# Patient Record
Sex: Female | Born: 1965 | Race: Black or African American | Hispanic: No | Marital: Single | Smoking: Current every day smoker
Health system: Southern US, Community
[De-identification: ages and names within clinical notes are randomized; demographics above are authoritative.]

## PROBLEM LIST (undated history)

## (undated) DIAGNOSIS — I10 Essential (primary) hypertension: Secondary | ICD-10-CM

## (undated) DIAGNOSIS — I471 Supraventricular tachycardia, unspecified: Secondary | ICD-10-CM

## (undated) DIAGNOSIS — J45909 Unspecified asthma, uncomplicated: Secondary | ICD-10-CM

## (undated) HISTORY — DX: Essential (primary) hypertension: I10

## (undated) HISTORY — DX: Supraventricular tachycardia, unspecified: I47.10

## (undated) HISTORY — DX: Unspecified asthma, uncomplicated: J45.909

## (undated) HISTORY — PX: KNEE SURGERY: SHX244

## (undated) HISTORY — DX: Supraventricular tachycardia: I47.1

---

## 2012-12-16 ENCOUNTER — Encounter: Payer: Self-pay | Admitting: Cardiovascular Disease

## 2012-12-16 DIAGNOSIS — R0602 Shortness of breath: Secondary | ICD-10-CM

## 2013-02-25 ENCOUNTER — Encounter: Payer: Self-pay | Admitting: Cardiology

## 2013-03-23 ENCOUNTER — Encounter: Payer: Self-pay | Admitting: Cardiovascular Disease

## 2013-03-28 ENCOUNTER — Encounter: Payer: Medicaid Other | Admitting: Cardiovascular Disease

## 2013-03-28 ENCOUNTER — Encounter: Payer: Self-pay | Admitting: Cardiovascular Disease

## 2013-03-28 ENCOUNTER — Ambulatory Visit (INDEPENDENT_AMBULATORY_CARE_PROVIDER_SITE_OTHER): Payer: Medicaid Other | Admitting: Cardiovascular Disease

## 2013-03-28 VITALS — BP 139/89 | HR 91 | Ht 64.0 in | Wt 180.0 lb

## 2013-03-28 DIAGNOSIS — Z7189 Other specified counseling: Secondary | ICD-10-CM

## 2013-03-28 DIAGNOSIS — I10 Essential (primary) hypertension: Secondary | ICD-10-CM

## 2013-03-28 DIAGNOSIS — R079 Chest pain, unspecified: Secondary | ICD-10-CM | POA: Insufficient documentation

## 2013-03-28 DIAGNOSIS — I471 Supraventricular tachycardia: Secondary | ICD-10-CM

## 2013-03-28 DIAGNOSIS — F17200 Nicotine dependence, unspecified, uncomplicated: Secondary | ICD-10-CM

## 2013-03-28 DIAGNOSIS — Z716 Tobacco abuse counseling: Secondary | ICD-10-CM

## 2013-03-28 MED ORDER — DILTIAZEM HCL 30 MG PO TABS: 30.0000 mg | ORAL_TABLET | ORAL | Status: DC | PRN

## 2013-03-28 NOTE — Patient Instructions (Signed)
   Diltiazem 30mg  as needed for rapid heart rate, no more than 2 in 24 hour time frame - new sent to pharm Continue all other medications.   Establish with primary MD - list provided Your physician has requested that you have a stress echocardiogram. For further information please visit [email or web address] Please follow instruction sheet as given.  - please call office when you are ready to schedule  Follow up in  6-8 weeks

## 2013-03-28 NOTE — Progress Notes (Signed)
Patient ID: ONETA SIGMAN, female   DOB: 1966/04/28, 47 y.o.   MRN: [id number]    CARDIOLOGY CONSULT NOTE  Patient ID: PENNIE VANBLARCOM MRN: [id number] DOB/AGE: 1966/04/25 47 y.o.   Reason for Consultation: PSVT  HPI: Mrs. Vannatter is a 47 year old woman with a PMH significant for PSVT, dating back to 2008. She was hospitalized in 04/2012 with this. At that time, her ECG revealed a short RP tachycardia, which broke with IV adenosine. She had a mildly abnormal troponin at that time (0.24), but a bedside echocardiogram was reportedly normal. She had been tried on metoprolol 25 mg bid in the past, but it caused her to experience fatigue. For this reason, she's been on diltiazem.  She continues to smoke about 0.5 ppd.  She's been treated for acute bronchitis and is finishing a Z-pak and oral prednisone. She had fevers/chills last week. She experiences band-like chest tightness under the breasts and around the back, which occurs at least once a week. She denies associated diaphoresis and very seldom gets lightheaded. She denies syncope. She sometimes experiences associated shortness of breath. The chest pain has been going on for the past year, but more frequent recently.  She had been considering an ablation for SVT last year. She experiences palpitations once a month, but most recently in July.  She is here with her friend, Rosalia Hammers, who delivers furniture for [employer].  SocHx: smokes 0.5 ppd. Moved here from [place] one year ago. FamHx: grandfather had h/o MI's.   Allergies  Allergen Reactions  . Beta Adrenergic Blockers     Makes her tired    Current Outpatient Prescriptions  Medication Sig Dispense Refill  . albuterol (PROVENTIL) 4 MG tablet Take 4 mg by mouth 4 (four) times daily as needed.      . diltiazem (CARDIZEM) 30 MG tablet Take 30 mg by mouth 3 (three) times daily as needed.       No current facility-administered medications for this visit.    Past Medical History   Diagnosis Date  . SVT (supraventricular tachycardia)   . Asthma     questionable  . Hypertension     Past Surgical History  Procedure Laterality Date  . Cesarean section    . Knee surgery      History   Social History  . Marital Status: Single    Spouse Name: N/A    Number of Children: N/A  . Years of Education: N/A   Occupational History  . Not on file.   Social History Main Topics  . Smoking status: Current Every Day Smoker -- 0.12 packs/day for 13 years    Types: Cigarettes    Start date: 06/30/2000  . Smokeless tobacco: Never Used  . Alcohol Use: Yes  . Drug Use: No  . Sexual Activity: Not on file   Other Topics Concern  . Not on file   Social History Narrative  . No narrative on file     Family History  Problem Relation Age of Onset  . Heart attack      grandfather     Prior to Admission medications   Medication Sig Start Date End Date Taking? Authorizing Provider  albuterol (PROVENTIL) 4 MG tablet Take 4 mg by mouth 4 (four) times daily as needed.   Yes Historical Provider, MD  diltiazem (CARDIZEM) 30 MG tablet Take 30 mg by mouth 3 (three) times daily as needed.   Yes Historical Provider, MD     Review of systems  complete and found to be negative unless listed above in HPI   139/89 mmHg, 151/100 91 bpm  Physical exam Height 5\' 4"  (1.626 m), weight 180 lb (81.647 kg). General: NAD Neck: No JVD, no thyromegaly or thyroid nodule.  Lungs: Prolonged expiratory phase with faint wheezes bilaterally with normal respiratory effort. CV: Nondisplaced PMI.  Heart regular S1/S2, no S3/S4, no murmur.  No peripheral edema.  No carotid bruit.  Normal pedal pulses.  Abdomen: Soft, nontender, no hepatosplenomegaly, no distention.  Skin: Intact without lesions or rashes.  Neurologic: Alert and oriented x 3.  Psych: Normal affect. Extremities: No clubbing or cyanosis.  HEENT: Normal.   Labs:   No results found for this basename: WBC, HGB, HCT, MCV, PLT    No results found for this basename: NA, K, CL, CO2, BUN, CREATININE, CALCIUM, LABALBU, PROT, BILITOT, ALKPHOS, ALT, AST, GLUCOSE,  in the last 168 hours No results found for this basename: CKTOTAL, CKMB, CKMBINDEX, TROPONINI    No results found for this basename: CHOL   No results found for this basename: HDL   No results found for this basename: LDLCALC   No results found for this basename: TRIG   No results found for this basename: CHOLHDL   No results found for this basename: LDLDIRECT        Studies: No results found.  ASSESSMENT AND PLAN:  1. PSVT: given that she only experiences once a month, and prefers not to be on long-acting diltiazem, I will prescribe a 90-day supply of 30 mg tablets to be taken prn. I gave her information on a radiofrequency ablation and should she decide on pursuing this, she will inform me.  2. Chest pain: her primary risk factor for CAD is tobacco use. Given that she is still getting over acute bronchitis, I will pursue a stress echocardiogram afterwards, when she is able to give a good effort on a treadmill.  3. HTN: elevated today, but she has also been taking a week of prednisone. I have recommended she obtain a blood pressure monitor. I have asked the patient to check blood pressure readings 4-5 times per week, at different times throughout the day, in order to get a better approximation of mean BP values. These results will be provided to me at the end of that period so that I can determine if antihypertensive medication titration is indicated.  4. Tobacco abuse: I have given her extensive counseling on cessation.  Dispo: I will try to help arrange for her to obtain a PCP.  Signed: Prentice Docker, M.D., F.A.C.C.  03/28/2013, 1:14 PM

## 2013-04-01 ENCOUNTER — Encounter: Payer: Medicaid Other | Admitting: Cardiovascular Disease

## 2013-04-27 ENCOUNTER — Encounter: Payer: Self-pay | Admitting: *Deleted

## 2013-05-09 ENCOUNTER — Ambulatory Visit: Payer: Medicaid Other | Admitting: Cardiovascular Disease

## 2013-05-13 ENCOUNTER — Encounter (HOSPITAL_COMMUNITY): Payer: Self-pay

## 2013-05-13 ENCOUNTER — Ambulatory Visit (HOSPITAL_COMMUNITY)
Admission: RE | Admit: 2013-05-13 | Discharge: 2013-05-13 | Disposition: A | Discharge status: Home / Self Care | Payer: Medicaid Other | Source: Ambulatory Visit | Attending: Cardiovascular Disease | Admitting: Cardiovascular Disease

## 2013-05-13 DIAGNOSIS — I10 Essential (primary) hypertension: Secondary | ICD-10-CM | POA: Insufficient documentation

## 2013-05-13 DIAGNOSIS — R079 Chest pain, unspecified: Secondary | ICD-10-CM | POA: Insufficient documentation

## 2013-05-13 DIAGNOSIS — F17200 Nicotine dependence, unspecified, uncomplicated: Secondary | ICD-10-CM | POA: Insufficient documentation

## 2013-05-13 DIAGNOSIS — I471 Supraventricular tachycardia, unspecified: Secondary | ICD-10-CM | POA: Insufficient documentation

## 2013-05-13 DIAGNOSIS — Z6831 Body mass index (BMI) 31.0-31.9, adult: Secondary | ICD-10-CM | POA: Insufficient documentation

## 2013-05-13 DIAGNOSIS — R072 Precordial pain: Secondary | ICD-10-CM

## 2013-05-13 NOTE — Progress Notes (Addendum)
Stress Lab Nurses Notes - Rhonda Patel  Rhonda Patel 05/13/2013 Reason for doing test: Chest Pain and PSVT Type of test: Stress Echo Nurse performing test: Parke Poisson, RN Nuclear Medicine Tech: Not Applicable Echo Tech: Veda Canning MD performing test: Branch / K.Lawrence NP Family MD : NPCP  Test explained and consent signed: yes IV started: No IV started Symptoms: Fatigue & SOB Treatment/Intervention: None Reason test stopped: reached target HR After recovery IV was: NA Patient to return to [facility]. Med at :NA Patient discharged: Home Patient's Condition upon discharge was: stable Comments: During test BP 196/89 & HR 179.  Recovery BP 136/96 & HR 100.  Symptoms resolved in recovery. [name] T

## 2013-05-13 NOTE — Progress Notes (Signed)
*  PRELIMINARY RESULTS* Echocardiogram Echocardiogram Stress Test has been performed.  Rhonda Patel 05/13/2013, 12:48 PM

## 2013-05-16 ENCOUNTER — Encounter: Payer: Self-pay | Admitting: *Deleted

## 2013-05-30 ENCOUNTER — Encounter: Payer: Self-pay | Admitting: Cardiovascular Disease

## 2013-05-30 ENCOUNTER — Ambulatory Visit (INDEPENDENT_AMBULATORY_CARE_PROVIDER_SITE_OTHER): Payer: Medicaid Other | Admitting: Cardiovascular Disease

## 2013-05-30 VITALS — BP 136/85 | HR 88 | Ht 64.75 in | Wt 178.0 lb

## 2013-05-30 DIAGNOSIS — E876 Hypokalemia: Secondary | ICD-10-CM

## 2013-05-30 DIAGNOSIS — I10 Essential (primary) hypertension: Secondary | ICD-10-CM

## 2013-05-30 DIAGNOSIS — Z7189 Other specified counseling: Secondary | ICD-10-CM

## 2013-05-30 DIAGNOSIS — R079 Chest pain, unspecified: Secondary | ICD-10-CM

## 2013-05-30 DIAGNOSIS — K219 Gastro-esophageal reflux disease without esophagitis: Secondary | ICD-10-CM

## 2013-05-30 DIAGNOSIS — Z716 Tobacco abuse counseling: Secondary | ICD-10-CM

## 2013-05-30 DIAGNOSIS — F17200 Nicotine dependence, unspecified, uncomplicated: Secondary | ICD-10-CM

## 2013-05-30 DIAGNOSIS — I471 Supraventricular tachycardia: Secondary | ICD-10-CM

## 2013-05-30 MED ORDER — OMEPRAZOLE 20 MG PO CPDR: 20.0000 mg | DELAYED_RELEASE_CAPSULE | Freq: Every day | ORAL | Status: DC

## 2013-05-30 MED ORDER — DILTIAZEM HCL 30 MG PO TABS: 30.0000 mg | ORAL_TABLET | Freq: Two times a day (BID) | ORAL | Status: DC

## 2013-05-30 NOTE — Progress Notes (Signed)
Patient ID: Rhonda Patel, female   DOB: 08-20-1965, 47 y.o.   MRN: [id number]      SUBJECTIVE: The patient is here to followup on the results of her cardiovascular testing. She underwent a stress echocardiogram for the evaluation of chest pain, and this test was found to be normal with a Duke treadmill score of 8, giving her a low risk of future cardiovascular events. She had been doing well since her last office visit, until she experienced palpitations on the Monday before Thanksgiving. She said it started suddenly. At her last office visit, I prescribed diltiazem 30 mg to be taken on an as needed basis and she took one tablet. When the palpitations didn't subside she went to [facility]. By the time she was taken back into the room, or palpitations subsided and her heart rate slowed down. She tells me that her potassium level was 3 and she was prescribed potassium supplements, but hasn't started taking this yet. She continues to smoke. She has been having bad heartburn and continues to experience a bandlike chest tightness which goes underneath her breasts. She also has been experiencing dysphagia for solids and not liquids. She says that the tightness is significant enough that she has to remove her bra and then experiences some relief, but the tightness doesn't subside completely. She denies leg swelling. She has noticed herself wheezing on occasion. She had childhood asthma which she outgrew by the age of 29.    Allergies  Allergen Reactions  . Beta Adrenergic Blockers     Makes her tired  . Metoprolol Other (See Comments)    Patient cannot tolerate higher dose of 25 mg daily    Current Outpatient Prescriptions  Medication Sig Dispense Refill  . albuterol (PROVENTIL) 4 MG tablet Take 4 mg by mouth 4 (four) times daily as needed.      . diltiazem (CARDIZEM) 30 MG tablet Take 1 tablet (30 mg total) by mouth as needed. For rapid heart rate.  No more than  2 in 24 hour time frame  90 tablet  3   No current facility-administered medications for this visit.    Past Medical History  Diagnosis Date  . SVT (supraventricular tachycardia)   . Asthma     questionable  . Hypertension     Past Surgical History  Procedure Laterality Date  . Cesarean section    . Knee surgery      History   Social History  . Marital Status: Single    Spouse Name: N/A    Number of Children: N/A  . Years of Education: N/A   Occupational History  . Not on file.   Social History Main Topics  . Smoking status: Current Every Day Smoker -- 0.12 packs/day for 13 years    Types: Cigarettes    Start date: 06/30/2000  . Smokeless tobacco: Never Used  . Alcohol Use: Yes  . Drug Use: No  . Sexual Activity: Not on file   Other Topics Concern  . Not on file   Social History Narrative  . No narrative on file     Filed Vitals:   05/30/13 1416  BP: 136/85  Pulse: 88  Height: 5' 4.75" (1.645 m)  Weight: 178 lb (80.74 kg)    PHYSICAL EXAM General: NAD Neck: No JVD, no thyromegaly or thyroid nodule.  Lungs: Clear to auscultation bilaterally with normal respiratory effort. CV: Nondisplaced PMI.  Heart regular S1/S2, no S3/S4, no murmur.  No  peripheral edema.  No carotid bruit.  Normal pedal pulses.  Abdomen: Soft, nontender, no hepatosplenomegaly, no distention.  Neurologic: Alert and oriented x 3.  Psych: Normal affect. Extremities: No clubbing or cyanosis.   ECG: reviewed and available in electronic records.      ASSESSMENT AND PLAN: 1. PSVT: I will transition diltiazem from a prn basis to it being scheduled 30 mg bid, given her most recent episode, which is her preference. I previously gave her information on a radiofrequency ablation and should she decide on pursuing this, she will inform me.  2. Chest pain: her stress echocardiogram was normal with a Duke treadmill score of 8, portending a low risk for future cardiovascular events. She  appears to have GERD and I did inform her that continued tobacco use will only serve to exacerbate this. I will initiate omeprazole 20 mg daily. There may be some component of asthma as well, given her ongoing tobacco use and audible wheezing. 3. HTN: normotensive today. Will continue to monitor this. 4. GERD: she has symptoms (heartburn, dysphagia for solids) consistent with significant GERD. I will initiate omeprazole 20 mg daily. 5. Hypokalemia: I instructed her to take the K supplementation she was prescribed, given her K of 3 at the ED. I instructed her to consume a diet rich in potassium-rich foods. I will check a BMET before her next visit. 6. Tobacco abuse: I have given her extensive counseling on cessation.   Dispo: f/u in 2 months.    Prentice Docker, M.D., F.A.C.C.

## 2013-05-30 NOTE — Patient Instructions (Signed)
   Increase Cardizem to 30mg  twice a day - new sent to pharm  Begin Omeprazole 20mg  daily - new sent to pharm Continue all other medications.   Lab for BMET - do just prior to next office visit Office will contact with results via phone or letter.   Follow up in  2 months

## 2013-08-08 ENCOUNTER — Ambulatory Visit: Payer: Medicaid Other | Admitting: Cardiovascular Disease

## 2013-09-14 ENCOUNTER — Encounter: Payer: Self-pay | Admitting: Cardiovascular Disease

## 2013-09-14 ENCOUNTER — Ambulatory Visit: Payer: Medicaid Other | Admitting: Cardiovascular Disease

## 2015-04-16 ENCOUNTER — Encounter: Payer: Self-pay | Admitting: Gastroenterology

## 2015-04-19 ENCOUNTER — Emergency Department (HOSPITAL_COMMUNITY): Payer: 59

## 2015-04-19 ENCOUNTER — Emergency Department (HOSPITAL_COMMUNITY)
Admission: EM | Admit: 2015-04-19 | Discharge: 2015-04-19 | Disposition: A | Discharge status: Home / Self Care | Payer: 59 | Attending: Emergency Medicine | Admitting: Emergency Medicine

## 2015-04-19 ENCOUNTER — Encounter (HOSPITAL_COMMUNITY): Payer: Self-pay | Admitting: *Deleted

## 2015-04-19 DIAGNOSIS — R197 Diarrhea, unspecified: Secondary | ICD-10-CM | POA: Insufficient documentation

## 2015-04-19 DIAGNOSIS — R6883 Chills (without fever): Secondary | ICD-10-CM | POA: Diagnosis not present

## 2015-04-19 DIAGNOSIS — Z79899 Other long term (current) drug therapy: Secondary | ICD-10-CM | POA: Diagnosis not present

## 2015-04-19 DIAGNOSIS — I10 Essential (primary) hypertension: Secondary | ICD-10-CM | POA: Diagnosis not present

## 2015-04-19 DIAGNOSIS — R1033 Periumbilical pain: Secondary | ICD-10-CM | POA: Insufficient documentation

## 2015-04-19 DIAGNOSIS — J45909 Unspecified asthma, uncomplicated: Secondary | ICD-10-CM | POA: Diagnosis not present

## 2015-04-19 DIAGNOSIS — R1030 Lower abdominal pain, unspecified: Secondary | ICD-10-CM | POA: Diagnosis present

## 2015-04-19 DIAGNOSIS — R1110 Vomiting, unspecified: Secondary | ICD-10-CM | POA: Insufficient documentation

## 2015-04-19 DIAGNOSIS — R61 Generalized hyperhidrosis: Secondary | ICD-10-CM | POA: Diagnosis not present

## 2015-04-19 DIAGNOSIS — Z720 Tobacco use: Secondary | ICD-10-CM | POA: Insufficient documentation

## 2015-04-19 DIAGNOSIS — R109 Unspecified abdominal pain: Secondary | ICD-10-CM

## 2015-04-19 LAB — URINALYSIS, ROUTINE W REFLEX MICROSCOPIC
BILIRUBIN URINE: NEGATIVE
Glucose, UA: NEGATIVE mg/dL
Leukocytes, UA: NEGATIVE
NITRITE: NEGATIVE
PH: 6 (ref 5.0–8.0)
Protein, ur: NEGATIVE mg/dL
Specific Gravity, Urine: 1.02 (ref 1.005–1.030)
UROBILINOGEN UA: 0.2 mg/dL (ref 0.0–1.0)

## 2015-04-19 LAB — COMPREHENSIVE METABOLIC PANEL
ALT: 35 U/L (ref 14–54)
ANION GAP: 14 (ref 5–15)
AST: 68 U/L — ABNORMAL HIGH (ref 15–41)
Albumin: 4.3 g/dL (ref 3.5–5.0)
Alkaline Phosphatase: 113 U/L (ref 38–126)
BILIRUBIN TOTAL: 1.1 mg/dL (ref 0.3–1.2)
BUN: 11 mg/dL (ref 6–20)
CHLORIDE: 97 mmol/L — AB (ref 101–111)
CO2: 26 mmol/L (ref 22–32)
Calcium: 9.2 mg/dL (ref 8.9–10.3)
Creatinine, Ser: 0.74 mg/dL (ref 0.44–1.00)
Glucose, Bld: 96 mg/dL (ref 65–99)
POTASSIUM: 3.2 mmol/L — AB (ref 3.5–5.1)
Sodium: 137 mmol/L (ref 135–145)
TOTAL PROTEIN: 7.9 g/dL (ref 6.5–8.1)

## 2015-04-19 LAB — CBC WITH DIFFERENTIAL/PLATELET
Basophils Absolute: 0.1 10*3/uL (ref 0.0–0.1)
Basophils Relative: 1 %
Eosinophils Absolute: 0.2 10*3/uL (ref 0.0–0.7)
Eosinophils Relative: 2 %
HEMATOCRIT: 40.5 % (ref 36.0–46.0)
Hemoglobin: 13.9 g/dL (ref 12.0–15.0)
LYMPHS PCT: 28 %
Lymphs Abs: 2.7 10*3/uL (ref 0.7–4.0)
MCH: 30.6 pg (ref 26.0–34.0)
MCHC: 34.3 g/dL (ref 30.0–36.0)
MCV: 89.2 fL (ref 78.0–100.0)
MONO ABS: 0.5 10*3/uL (ref 0.1–1.0)
MONOS PCT: 5 %
NEUTROS ABS: 6.3 10*3/uL (ref 1.7–7.7)
Neutrophils Relative %: 64 %
PLATELETS: 490 10*3/uL — AB (ref 150–400)
RBC: 4.54 MIL/uL (ref 3.87–5.11)
RDW: 14.2 % (ref 11.5–15.5)
WBC: 9.8 10*3/uL (ref 4.0–10.5)

## 2015-04-19 LAB — URINE MICROSCOPIC-ADD ON

## 2015-04-19 LAB — LIPASE, BLOOD: LIPASE: 35 U/L (ref 11–51)

## 2015-04-19 LAB — CBG MONITORING, ED: GLUCOSE-CAPILLARY: 89 mg/dL (ref 65–99)

## 2015-04-19 MED ORDER — IOHEXOL 300 MG/ML  SOLN
100.0000 mL | Freq: Once | INTRAMUSCULAR | Status: AC | PRN
  Administered 2015-04-19: 100 mL via INTRAVENOUS

## 2015-04-19 MED ORDER — MORPHINE SULFATE (PF) 4 MG/ML IV SOLN
4.0000 mg | Freq: Once | INTRAVENOUS | Status: AC
  Administered 2015-04-19: 4 mg via INTRAVENOUS
  Filled 2015-04-19: qty 1

## 2015-04-19 MED ORDER — PROCHLORPERAZINE EDISYLATE 5 MG/ML IJ SOLN
5.0000 mg | Freq: Once | INTRAMUSCULAR | Status: AC
  Administered 2015-04-19: 5 mg via INTRAVENOUS
  Filled 2015-04-19: qty 2

## 2015-04-19 MED ORDER — POTASSIUM CHLORIDE CRYS ER 20 MEQ PO TBCR
40.0000 meq | EXTENDED_RELEASE_TABLET | Freq: Once | ORAL | Status: AC
  Administered 2015-04-19: 40 meq via ORAL
  Filled 2015-04-19: qty 2

## 2015-04-19 NOTE — ED Notes (Signed)
Patient reports ongoing abd pain x 3 months. Has an appt to see GI doctor in November. Report weight loss of approx. 20 pounds in three months. Reports some diarrhea in the beginning and it is progressing to everyday.

## 2015-04-19 NOTE — ED Notes (Signed)
EDPA in to reeval pt

## 2015-04-19 NOTE — Discharge Instructions (Signed)
Your lab test are well within normal limits. Your CT scan shows no acute presurgical situation. Please see the GI specialist as scheduled. Please return to the emergency department if any emergent changes, problems, or concerns. Abdominal Pain, Adult Many things can cause belly (abdominal) pain. Most times, the belly pain is not dangerous. Many cases of belly pain can be watched and treated at home. HOME CARE   Do not take medicines that help you go poop (laxatives) unless told to by your doctor.  Only take medicine as told by your doctor.  Eat or drink as told by your doctor. Your doctor will tell you if you should be on a special diet. GET HELP IF:  You do not know what is causing your belly pain.  You have belly pain while you are sick to your stomach (nauseous) or have runny poop (diarrhea).  You have pain while you pee or poop.  Your belly pain wakes you up at night.  You have belly pain that gets worse or better when you eat.  You have belly pain that gets worse when you eat fatty foods.  You have a fever. GET HELP RIGHT AWAY IF:   The pain does not go away within 2 hours.  You keep throwing up (vomiting).  The pain changes and is only in the right or left part of the belly.  You have bloody or tarry looking poop. MAKE SURE YOU:   Understand these instructions.  Will watch your condition.  Will get help right away if you are not doing well or get worse.   This information is not intended to replace advice given to you by your health care provider. Make sure you discuss any questions you have with your health care provider.   Document Released: 12/03/2007 Document Revised: 07/07/2014 Document Reviewed: 02/23/2013 Elsevier Interactive Patient Education [employer].

## 2015-04-19 NOTE — ED Provider Notes (Signed)
CSN: [id number]     Arrival date & time 04/19/15  0801 History  By signing my name below, I, Rhonda Patel, attest that this documentation has been prepared under the direction and in the presence of  Rhonda Kocher, PA-C. Electronically Signed: Hansel Patel, ED Scribe. 04/19/2015. 8:31 AM.    Chief Complaint  Patient presents with  . Abdominal Pain   The history is provided by the patient. No language interpreter was used.   HPI Comments: Rhonda Patel is a 49 y.o. female who presents to the Emergency Department complaining of moderate, gradually worsening, intermittent, sharp lower abdominal pain and cramping onset 3 months ago and worsened yesterday with associated diarrhea, unintentional weight loss (~20lbs), emesis (reports vomiting food up), chills, diaphoresis. She reports emesis every morning, but none at night or during the day. She notes occasional blood streaking in the emesis but reports that is is mostly bile or food previously consumed. She notes that symptoms are worsened with food consumption. Pt was seen by her PCP 14 days ago, who was concerned for a low grade fever. She was referred to GI and was not able to get in until Dec 8. She notes that she requested a referral for another GI to get an appointment sooner but has not yet heard back for scheduling.  No h/o abdominal surgery aside from c-section. No recent changes in diet. Pt was recently prescribed cardizem and nexium by her PCP. She denies known fevers, bloody stools.    Past Medical History  Diagnosis Date  . SVT (supraventricular tachycardia) ([place])   . Asthma     questionable  . Hypertension    Past Surgical History  Procedure Laterality Date  . Cesarean section    . Knee surgery     Family History  Problem Relation Age of Onset  . Heart attack      grandfather   Social History  Substance Use Topics  . Smoking status: Current Every Day Smoker -- 0.12 packs/day for 13 years    Types: Cigarettes    Start date:  06/30/2000  . Smokeless tobacco: Never Used  . Alcohol Use: Yes   OB History    No data available     Review of Systems  Constitutional: Positive for chills, diaphoresis and unexpected weight change. Negative for fever.  Gastrointestinal: Positive for vomiting, abdominal pain and diarrhea. Negative for blood in stool.  All other systems reviewed and are negative.  Allergies  Beta adrenergic blockers and Metoprolol  Home Medications   Prior to Admission medications   Medication Sig Start Date End Date Taking? Authorizing Provider  albuterol (PROVENTIL) 4 MG tablet Take 4 mg by mouth 4 (four) times daily as needed.    Historical Provider, MD  diltiazem (CARDIZEM) 30 MG tablet Take 1 tablet (30 mg total) by mouth 2 (two) times daily. 05/30/13   Herminio Commons, MD  omeprazole (PRILOSEC) 20 MG capsule Take 1 capsule (20 mg total) by mouth daily. 05/30/13   Herminio Commons, MD   BP 132/94 mmHg  Pulse 90  Temp(Src) 97.9 F (36.6 C) (Oral)  Resp 16  Ht 5\' 4"  (1.626 m)  Wt 161 lb (73.029 kg)  BMI 27.62 kg/m2  SpO2 100% Physical Exam  Constitutional: She is oriented to person, place, and time. She appears well-developed and well-nourished.  HENT:  Head: Normocephalic and atraumatic.  Eyes: Conjunctivae and EOM are normal. Pupils are equal, round, and reactive to light.  Conjunctiva is clear.  Neck: Normal range of motion. Neck supple.  Cardiovascular: Normal rate, regular rhythm and normal heart sounds.   Pulmonary/Chest: Effort normal and breath sounds normal. No respiratory distress. She has no wheezes. She has no rales.  Lungs CTA bilaterally.   Abdominal: Soft. Bowel sounds are normal. She exhibits no distension. There is tenderness.  Moderate periumbilical TTP. Good bowel sounds.   Genitourinary:  Stool negative for occult blood. Chaperone present during exam.  Musculoskeletal: Normal range of motion. She exhibits no edema.  Negative Homan's sign. Cap refill is  less than 2 seconds.   Lymphadenopathy:    She has no cervical adenopathy.  Neurological: She is alert and oriented to person, place, and time.  Skin: Skin is warm and dry.  Good color.   Psychiatric: She has a normal mood and affect. Her behavior is normal.  Nursing note and vitals reviewed.  ED Course  Procedures (including critical care time) DIAGNOSTIC STUDIES: Oxygen Saturation is 100% on RA, normal by my interpretation.    COORDINATION OF CARE: 8:29 AM Discussed treatment plan with pt at bedside and pt agreed to plan. Plan for CBC, Cmet, CT Abd/pelv, Urinalysis.    Labs Review Labs Reviewed - No data to display  Imaging Review No results found. I have personally reviewed and evaluated these images and lab results as part of my medical decision-making.  MDM  Vital signs reviewed. Pain improved with IV pain meds. No vomiting or diarrhea while in ED. UA non-acute. Cbc wnl. CMP reveals low potassium of 3.2. AST elevated at 68 o/w wnl. Lipase wnl, doubt pancreatitis. CT abd/pelvis is negative for acute findings. Exam and lab and CT discussed with patient in terms she understands. Strongly encourage pt to see GI as scheduled. They will return to the ED if any emergent changes or concerns.   Final diagnoses:  None    *I have reviewed nursing notes, vital signs, and all appropriate lab and imaging results for this patient.** **I personally performed the services described in this documentation, which was scribed in my presence. The recorded information has been reviewed and is accurate.Rhonda Kocher, PA-C 04/20/15 2119  Rhonda Fraise, MD 04/20/15 [phone]

## 2015-05-03 ENCOUNTER — Encounter: Payer: Self-pay | Admitting: Gastroenterology

## 2015-05-03 ENCOUNTER — Ambulatory Visit (INDEPENDENT_AMBULATORY_CARE_PROVIDER_SITE_OTHER): Payer: 59 | Admitting: Gastroenterology

## 2015-05-03 VITALS — BP 142/98 | HR 96 | Temp 98.3°F | Ht 64.0 in | Wt 165.0 lb

## 2015-05-03 DIAGNOSIS — K589 Irritable bowel syndrome without diarrhea: Secondary | ICD-10-CM | POA: Insufficient documentation

## 2015-05-03 DIAGNOSIS — R197 Diarrhea, unspecified: Secondary | ICD-10-CM

## 2015-05-03 DIAGNOSIS — K219 Gastro-esophageal reflux disease without esophagitis: Secondary | ICD-10-CM | POA: Insufficient documentation

## 2015-05-03 MED ORDER — ELUXADOLINE 100 MG PO TABS: 1.0000 | ORAL_TABLET | Freq: Two times a day (BID) | ORAL | Status: DC

## 2015-05-03 NOTE — Addendum Note (Signed)
Addended by: Danie Binder on: 05/03/2015 09:31 AM   Modules accepted: Orders

## 2015-05-03 NOTE — Assessment & Plan Note (Signed)
SYMPTOMS FAIRLY WELL CONTROLLED.  AVOID FOOD THAT TRIGGERS REFLUX. SEE INFO BELOW.   Add lactobacillus PROBIOTIC DAILY.  TRY VIBERZI. TAKE ONE TABLET TWICE DAILY. USE IMODIUM 1 OR 2 TIMES A DAY IF NEEDED.  YOU MUST CALL IF YOU GO MORE THAN 4 DAYS WITHOUT A BOWEL MOVEMENT OR YOU DEVELOP INCREASE IN NAUSEA, VOMITING, OR ABDOMINAL PAIN.  CONTINUE NEXIUM. TAKE 30 MINUTES BEFORE YOUR FIRST MEAL DAILY.  PLEASE CALL IN ONE MONTH IF YOUR SYMPTOMS ARE NOT BETTER.   FOLLOW UP IN 6 WEEKS.

## 2015-05-03 NOTE — Progress Notes (Signed)
APPT MADE

## 2015-05-03 NOTE — Progress Notes (Signed)
Subjective:    Patient ID: Rhonda Patel, female    DOB: May 03, 1966, 49 y.o.   MRN: [id number]  VYAS,DHRUV B., MD   HPI WAS HAVING REGULAR BOWEL: 2-3X/DAY. SYMPTOMS: began 3 mos ago, now worse over last 1.5 mos. INCLUDES ABDOMINAL PAIN, RUMBLING, FOLLOWED BY LOOSE STOOL/ EXPLOSIVE WATERY STOOLS(UP TO 5X IN A WORK DAY). NL FORMED STOOL: ONCE OR TWICE A WEEK. MAY BE UP WITH BM AND ABDOMINAL CRAMPS AT NIGHT 1X/WEEK. FEELS PRESSURE UNDER BREASTS. PAIN: SHARP. ACHY-UPPER AND LOWER. BETTER: AFTER BM THEN THE CYCLE STARTS AGAIN AND LASTS UNTIL SHE HAS A BM. COUPLE TIMES ON WAY HOME HAS HAD A IMMEDIATE DRIVE BY. RIDING AROUND WITH TOILET PAPER ON STAND BY. GETS HOT AND COLD. FLARES BEGAN AFTER STRESSFUL DAY AT WORK. HEARTBURN: DRINK WATER AND HAS BURNING AND FEEL LIKE SOMETHING COMING BACK UP. [place] A LOT. CAFFEINE: 1 CUP COFFEE/DAY. SMOKES: < 1 PK/DAY. QUIT BC OR GOODY POWDERS. RARE IBUPROFEN/MOTRIN, OR NAPROXEN/ALEVE: 1-2X/MO. ETOH: 3-4 X/WEEK(VODKA IN MIXED DRINK, NO CHANGE). WEIGHT: ??181 TO 165 LBS. TREATED FOR ANXIETY AND DEPRESSION BUT COULDN'T TAKE MEDS. NO THYROID PROBLEMS. NO ABX, TRAVEL, OR WELL WATER. NO NH OR HOSPITAL VISIT. NO SICK CONTACT. NO SORES IN MOUTH, RASH ON LEGS. OCCASIONAL JOINT PAIN, AND BACK PAIN.  PT DENIES FEVER, CHILLS, HEMATOCHEZIA, nausea, vomiting, melena, CHEST PAIN, SHORTNESS OF BREATH,  CHANGE IN BOWEL IN HABITS, constipation, problems swallowing, OR problems with sedation.   Past Medical History  Diagnosis Date  . SVT (supraventricular tachycardia) ([place])   . Asthma     questionable  . Hypertension    Past Surgical History  Procedure Laterality Date  . Cesarean section    . Knee surgery     Allergies  Allergen Reactions  . Beta Adrenergic Blockers     Makes her tired  . Metoprolol Other (See Comments)    Patient cannot tolerate higher dose of 25 mg daily    Current Outpatient Prescriptions  Medication Sig Dispense Refill  . diltiazem (CARDIZEM)  30 MG tablet Take 1 tablet (30 mg total) by mouth 2 (two) times daily.    Marland Kitchen esomeprazole (NEXIUM) 40 MG capsule Take 40 mg by mouth daily aFTER BREAKFAST    . lisinopril-hydrochlorothiazide (PRINZIDE,ZESTORETIC) 10-12.5 MG tablet Take 1 tablet by mouth daily.    . sucralfate (CARAFATE) 1 G tablet Take 1 g by mouth.    . diphenoxylate-atropine (LOMOTIL) 2.5-0.025 MG tablet Take 1 tablet by mouth 4 (four) times daily as needed for diarrhea or loose stools.     Family History  Problem Relation Age of Onset  . Heart attack      grandfather  . Colon cancer Neg Hx   . Colon polyps Neg Hx   . Celiac disease Neg Hx   . Pancreatic cancer Neg Hx   . Stomach cancer Neg Hx   . Ulcerative colitis Neg Hx   . Crohn's disease Neg Hx     Social History   Social History  . Marital Status: Single    Spouse Name: N/A  . Number of Children: N/A  . Years of Education: N/A   Social History Main Topics  . Smoking status: Current Every Day Smoker -- 0.12 packs/day for 13 years    Types: Cigarettes    Start date: 06/30/2000  . Smokeless tobacco: Never Used  . Alcohol Use: Yes  . Drug Use: No  . Sexual Activity: Not Asked   Other Topics Concern  . None  Social History Narrative   Review of Systems PER HPI OTHERWISE ALL SYSTEMS ARE NEGATIVE.     Objective:   Physical Exam  Constitutional: She is oriented to person, place, and time. She appears well-developed and well-nourished. No distress.  HENT:  Head: Normocephalic and atraumatic.  Mouth/Throat: Oropharynx is clear and moist. No oropharyngeal exudate.  Eyes: Pupils are equal, round, and reactive to light. No scleral icterus.  Neck: Normal range of motion. Neck supple.  Cardiovascular: Normal rate, regular rhythm and normal heart sounds.   Pulmonary/Chest: Effort normal and breath sounds normal. No respiratory distress.  Abdominal: Soft. Bowel sounds are normal. She exhibits no distension. There is no tenderness.  Musculoskeletal: She  exhibits no edema.  Lymphadenopathy:    She has no cervical adenopathy.  Neurological: She is alert and oriented to person, place, and time.  NO FOCAL DEFICITS   Psychiatric:  FLAT AFFECT, SLIGHTLY ANXIOUS MOOD  Vitals reviewed.         Assessment & Plan:

## 2015-05-03 NOTE — Progress Notes (Signed)
cc'ed to pcp °

## 2015-05-03 NOTE — Patient Instructions (Signed)
DRINK WATER TO KEEP YOUR URINE LIGHT YELLOW.  FOLLOW A LOW FAT DIET. MEATS SHOULD BE BAKED, BROILED, OR BOILED. SEE INFO BELOW.  AVOID FOOD THAT TRIGGERS REFLUX. SEE INFO BELOW.   Add lactobacillus PROBIOTIC DAILY.  TRY VIBERZI. TAKE ONE TABLET TWICE DAILY. USE IMODIUM 1 OR 2 TIMES A DAY IF NEEDED.  YOU MUST CALL IF YOU GO MORE THAN 4 DAYS WITHOUT A BOWEL MOVEMENT OR YOU DEVELOP INCREASE IN NAUSEA, VOMITING, OR ABDOMINAL PAIN.  CONTINUE NEXIUM. TAKE 30 MINUTES BEFORE YOUR FIRST MEAL DAILY.  PLEASE CALL IN ONE MONTH IF YOUR SYMPTOMS ARE NOT BETTER.   FOLLOW UP IN 6 WEEKS.    Lifestyle and home remedies TO HELP CONTROL HEARTBURN.  You may eliminate or reduce the frequency of heartburn by making the following lifestyle changes:  . Control your weight. Being overweight is a major risk factor for heartburn and GERD. Excess pounds put pressure on your abdomen, pushing up your stomach and causing acid to back up into your esophagus.   . Eat smaller meals. 4 TO 6 MEALS A DAY. This reduces pressure on the lower esophageal sphincter, helping to prevent the valve from opening and acid from washing back into your esophagus.   Dolphus Jenny your belt. Clothes that fit tightly around your waist put pressure on your abdomen and the lower esophageal sphincter.  .  . Eliminate heartburn triggers. Everyone has specific triggers. Common triggers such as fatty or fried foods, spicy food, tomato sauce, carbonated beverages, alcohol, chocolate, mint, garlic, onion, caffeine and nicotine may make heartburn worse.   Marland Kitchen Avoid stooping or bending. Tying your shoes is OK. Bending over for longer periods to weed your garden isn't, especially soon after eating.   . Don't lie down after a meal. Wait at least three to four hours after eating before going to bed, and don't lie down right after eating.   Marland Kitchen PLACE THE HEAD OF YOUR BED ON 6 INCH BLOCKS.  Alternative medicine . Several home remedies exist for treating  GERD, but they provide only temporary relief. They include drinking baking soda (sodium bicarbonate) added to water or drinking other fluids such as baking soda mixed with cream of tartar and water. . Although these liquids create temporary relief by neutralizing, washing away or buffering acids, eventually they aggravate the situation by adding gas and fluid to your stomach, increasing pressure and causing more acid reflux. Further, adding more sodium to your diet may increase your blood pressure and add stress to your heart, and excessive bicarbonate ingestion can alter the acid-base balance in your body.  Low-Fat Diet BREADS, CEREALS, PASTA, RICE, DRIED PEAS, AND BEANS These products are high in carbohydrates and most are low in fat. Therefore, they can be increased in the diet as substitutes for fatty foods. They too, however, contain calories and should not be eaten in excess. Cereals can be eaten for snacks as well as for breakfast.   FRUITS AND VEGETABLES It is good to eat fruits and vegetables. Besides being sources of fiber, both are rich in vitamins and some minerals. They help you get the daily allowances of these nutrients. Fruits and vegetables can be used for snacks and desserts.  MEATS Limit lean meat, chicken, [place], and fish to no more than 6 ounces per day. Beef, Pork, and Lamb Use lean cuts of beef, pork, and lamb. Lean cuts include:  Extra-lean ground beef.  Arm roast.  Sirloin tip.  Center-cut ham.  Round steak.  Loin  chops.  Rump roast.  Tenderloin.  Trim all fat off the outside of meats before cooking. It is not necessary to severely decrease the intake of red meat, but lean choices should be made. Lean meat is rich in protein and contains a highly absorbable form of iron. Premenopausal women, in particular, should avoid reducing lean red meat because this could increase the risk for low red blood cells (iron-deficiency anemia).  Chicken and [place] These are good  sources of protein. The fat of poultry can be reduced by removing the skin and underlying fat layers before cooking. Chicken and [place] can be substituted for lean red meat in the diet. Poultry should not be fried or covered with high-fat sauces. Fish and Shellfish Fish is a good source of protein. Shellfish contain cholesterol, but they usually are low in saturated fatty acids. The preparation of fish is important. Like chicken and [place], they should not be fried or covered with high-fat sauces. EGGS Egg whites contain no fat or cholesterol. They can be eaten often. Try 1 to 2 egg whites instead of whole eggs in recipes or use egg substitutes that do not contain yolk. MILK AND DAIRY PRODUCTS Use skim or 1% milk instead of 2% or whole milk. Decrease whole milk, natural, and processed cheeses. Use nonfat or low-fat (2%) cottage cheese or low-fat cheeses made from vegetable oils. Choose nonfat or low-fat (1 to 2%) yogurt. Experiment with evaporated skim milk in recipes that call for heavy cream. Substitute low-fat yogurt or low-fat cottage cheese for sour cream in dips and salad dressings. Have at least 2 servings of low-fat dairy products, such as 2 glasses of skim (or 1%) milk each day to help get your daily calcium intake. FATS AND OILS Reduce the total intake of fats, especially saturated fat. Butterfat, lard, and beef fats are high in saturated fat and cholesterol. These should be avoided as much as possible. Vegetable fats do not contain cholesterol, but certain vegetable fats, such as coconut oil, palm oil, and palm kernel oil are very high in saturated fats. These should be limited. These fats are often used in bakery goods, processed foods, popcorn, oils, and nondairy creamers. Vegetable shortenings and some peanut butters contain hydrogenated oils, which are also saturated fats. Read the labels on these foods and check for saturated vegetable oils. Unsaturated vegetable oils and fats do not raise  blood cholesterol. However, they should be limited because they are fats and are high in calories. Total fat should still be limited to 30% of your daily caloric intake. Desirable liquid vegetable oils are corn oil, cottonseed oil, olive oil, canola oil, safflower oil, soybean oil, and sunflower oil. Peanut oil is not as good, but small amounts are acceptable. Buy a heart-healthy tub margarine that has no partially hydrogenated oils in the ingredients. Mayonnaise and salad dressings often are made from unsaturated fats, but they should also be limited because of their high calorie and fat content. Seeds, nuts, peanut butter, olives, and avocados are high in fat, but the fat is mainly the unsaturated type. These foods should be limited mainly to avoid excess calories and fat. OTHER EATING TIPS Snacks  Most sweets should be limited as snacks. They tend to be rich in calories and fats, and their caloric content outweighs their nutritional value. Some good choices in snacks are graham crackers, melba toast, soda crackers, bagels (no egg), English muffins, fruits, and vegetables. These snacks are preferable to snack crackers, [place] fries, TORTILLA CHIPS, and POTATO  chips. Popcorn should be air-popped or cooked in small amounts of liquid vegetable oil. Desserts Eat fruit, low-fat yogurt, and fruit ices instead of pastries, cake, and cookies. Sherbet, angel food cake, gelatin dessert, frozen low-fat yogurt, or other frozen products that do not contain saturated fat (pure fruit juice bars, frozen ice pops) are also acceptable.  COOKING METHODS Choose those methods that use little or no fat. They include: Poaching.  Braising.  Steaming.  Grilling.  Baking.  Stir-frying.  Broiling.  Microwaving.  Foods can be cooked in a nonstick pan without added fat, or use a nonfat cooking spray in regular cookware. Limit fried foods and avoid frying in saturated fat. Add moisture to lean meats by using water, broth,  cooking wines, and other nonfat or low-fat sauces along with the cooking methods mentioned above. Soups and stews should be chilled after cooking. The fat that forms on top after a few hours in the refrigerator should be skimmed off. When preparing meals, avoid using excess salt. Salt can contribute to raising blood pressure in some people.  EATING AWAY FROM HOME Order entres, potatoes, and vegetables without sauces or butter. When meat exceeds the size of a deck of cards (3 to 4 ounces), the rest can be taken home for another meal. Choose vegetable or fruit salads and ask for low-calorie salad dressings to be served on the side. Use dressings sparingly. Limit high-fat toppings, such as bacon, crumbled eggs, cheese, sunflower seeds, and olives. Ask for heart-healthy tub margarine instead of butter.

## 2015-05-03 NOTE — Assessment & Plan Note (Signed)
DIFFERENTIAL DIAGNOSIS INCLUDES: IBS-D, MICROSCOPIC COLITIS. LESS LIKELY THYROID DISTURBANCE, GIARDIASIS, C DIFF COLITIS, OR IBD.  AVOID FOOD THAT TRIGGERS REFLUX. SEE INFO BELOW.   Add lactobacillus PROBIOTIC DAILY.  TRY VIBERZI. TAKE ONE TABLET TWICE DAILY. USE IMODIUM 1 OR 2 TIMES A DAY IF NEEDED.  YOU MUST CALL IF YOU GO MORE THAN 4 DAYS WITHOUT A BOWEL MOVEMENT OR YOU DEVELOP INCREASE IN NAUSEA, VOMITING, OR ABDOMINAL PAIN.  CONTINUE NEXIUM. TAKE 30 MINUTES BEFORE YOUR FIRST MEAL DAILY.  PLEASE CALL IN ONE MONTH IF YOUR SYMPTOMS ARE NOT BETTER.   FOLLOW UP IN 6 WEEKS.

## 2015-05-09 ENCOUNTER — Telehealth: Payer: Self-pay

## 2015-05-09 NOTE — Telephone Encounter (Signed)
Pt left VM that she was not able to get her Viberzi at the pharmacy. I called and LMOM for her to have pharmacy send over the info for PA if that is what is needed.

## 2015-05-10 NOTE — Telephone Encounter (Signed)
Pt is aware that [facility] received the PA and is working on it.

## 2015-06-14 ENCOUNTER — Ambulatory Visit (INDEPENDENT_AMBULATORY_CARE_PROVIDER_SITE_OTHER): Payer: 59 | Admitting: Gastroenterology

## 2015-06-14 ENCOUNTER — Encounter: Payer: Self-pay | Admitting: Gastroenterology

## 2015-06-14 VITALS — BP 185/95 | HR 86 | Temp 97.6°F | Ht 64.0 in | Wt 169.0 lb

## 2015-06-14 DIAGNOSIS — K589 Irritable bowel syndrome without diarrhea: Secondary | ICD-10-CM | POA: Diagnosis not present

## 2015-06-14 NOTE — Progress Notes (Signed)
ON RECALL  °

## 2015-06-14 NOTE — Patient Instructions (Addendum)
Try VIBERZI 75 MG DAILY. PLEASE CALL IF YOU WOULD LIKE YOUR PRESCRIPTION CHANGED.  DRINK WATER TO KEEP YOUR URINE LIGHT YELLOW.  FOLLOW A HIGH FIBER DIET. AVOID ITEMS THAT CAUSE BLOATING & GAS.   CONTINUE ACTIVIA AS LOG AS IT DOES NOT CAUSE DIARRHEA, BLOATING AND GAS  TAKE A PROBIOTIC DAILY FOR 3 MOS ([facility] BRAND, [facility],OR  ALIGN).   FOLLOW UP IN 4 MOS.

## 2015-06-14 NOTE — Assessment & Plan Note (Signed)
DIARRHEA PREDOMINANT. CLINICALLY IMPROVED. SYMPTOMS NOT IDEALLY CONTROLLED.  REDUCE DOSE OF VIBERZI. Try VIBERZI 75 MG ONCE OR TWICE DAILY. PLEASE CALL IF YOU WOULD LIKE YOUR PRESCRIPTION CHANGED. DRINK WATER TO KEEP  URINE LIGHT YELLOW. FOLLOW A HIGH FIBER DIET. AVOID ITEMS THAT CAUSE BLOATING & GAS.  CONTINUE ACTIVIA AS LOG AS IT DOES NOT CAUSE DIARRHEA, BLOATING AND GAS TAKE A PROBIOTIC DAILY FOR 3 MOS ([facility] BRAND, [facility],OR  ALIGN).  FOLLOW UP IN 4 MOS.

## 2015-06-14 NOTE — Progress Notes (Signed)
CC'ED TO PCP 

## 2015-06-14 NOTE — Progress Notes (Signed)
   Subjective:    Patient ID: Rhonda Patel, female    DOB: 04-Dec-1965, 49 y.o.   MRN: [id number] Rhonda Patel., MD  HPI SEE NOV 2016. DIDN;T START PROBIOTIC. TAKING VIBERZI ONCE A DAY. CAN MAKE HER FEEL SLEEPY OR DIZZY. TAKES AFTER SHE EATS SOMETHING. RARE DIARRHEA WHEN SHE MISSES A DOSE BUT NOT AS EXPLOSIVE. EATS YOGURT FOR BREAKFAST(ACTIVIA OR OATMEAL). BMs: THIS WEEK #3 1-2X/DAY.  ABD PAIN IS BETTER. HAD IT LAST WEEK ONE DAY AND THAT ONLY HAPPENED ONCE. IF TAKES VIBERZI ONCE A DAY BECAUSE IT MAKES HER FEEL FUNNY. PT DENIES FEVER, CHILLS, HEMATOCHEZIA, nausea, vomiting, melena, CHEST PAIN, SHORTNESS OF BREATH,  CHANGE IN BOWEL IN HABITS, constipation, problems swallowing, OR heartburn or indigestion.  Past Medical History  Diagnosis Date  . SVT (supraventricular tachycardia) ([place])   . Asthma     questionable  . Hypertension     Past Surgical History  Procedure Laterality Date  . Cesarean section    . Knee surgery      right    Allergies  Allergen Reactions  . Beta Adrenergic Blockers     Makes her tired  . Metoprolol Other (See Comments)    Patient cannot tolerate higher dose of 25 mg daily   Current Outpatient Prescriptions  Medication Sig Dispense Refill  . diltiazem (CARDIZEM) 30 MG tablet Take 1 tablet (30 mg total) by mouth 2 (two) times daily.    . diphenoxylate-atropine (LOMOTIL) 2.5-0.025 MG tablet Take 1 tablet by mouth 4 (four) times daily as needed for diarrhea or loose stools. MISSED VIBERZI ~1 WK AGO   . Eluxadoline (VIBERZI) 100 MG TABS Take 1 tablet by mouth 2 (two) times daily.    Marland Kitchen esomeprazole (NEXIUM) 40 MG capsule Take 40 mg by mouth daily at 12 noon.    Marland Kitchen lisinopril-hydrochlorothiazide (PRINZIDE,ZESTORETIC) 10-12.5 MG tablet Take 1 tablet by mouth daily.    . sucralfate (CARAFATE) 1 G tablet Take 1 g by mouth.     Review of Systems PER HPI OTHERWISE ALL SYSTEMS ARE NEGATIVE.    Objective:   Physical Exam  Constitutional: She is oriented to person,  place, and time. She appears well-developed and well-nourished. No distress.  HENT:  Head: Normocephalic and atraumatic.  Mouth/Throat: Oropharynx is clear and moist. No oropharyngeal exudate.  Eyes: Pupils are equal, round, and reactive to light. No scleral icterus.  Neck: Normal range of motion. Neck supple.  Cardiovascular: Normal rate, regular rhythm and normal heart sounds.   Pulmonary/Chest: Effort normal and breath sounds normal. No respiratory distress.  Abdominal: Soft. Bowel sounds are normal. She exhibits no distension. There is tenderness. There is no rebound and no guarding.  MILD TTP x4  Musculoskeletal: She exhibits no edema.  Lymphadenopathy:    She has no cervical adenopathy.  Neurological: She is alert and oriented to person, place, and time.  NO FOCAL DEFICITS  Psychiatric: She has a normal mood and affect.  Vitals reviewed.     Assessment & Plan:

## 2015-07-24 ENCOUNTER — Encounter: Payer: Self-pay | Admitting: Gastroenterology

## 2015-07-24 ENCOUNTER — Ambulatory Visit (INDEPENDENT_AMBULATORY_CARE_PROVIDER_SITE_OTHER): Payer: 59 | Admitting: Gastroenterology

## 2015-07-24 VITALS — BP 192/106 | HR 80 | Temp 97.8°F | Ht 64.0 in | Wt 160.4 lb

## 2015-07-24 DIAGNOSIS — K589 Irritable bowel syndrome without diarrhea: Secondary | ICD-10-CM

## 2015-07-24 LAB — CBC
HEMATOCRIT: 40.9 % (ref 36.0–46.0)
Hemoglobin: 13.6 g/dL (ref 12.0–15.0)
MCH: 30.1 pg (ref 26.0–34.0)
MCHC: 33.3 g/dL (ref 30.0–36.0)
MCV: 90.5 fL (ref 78.0–100.0)
MPV: 9.4 fL (ref 8.6–12.4)
Platelets: 471 10*3/uL — ABNORMAL HIGH (ref 150–400)
RBC: 4.52 MIL/uL (ref 3.87–5.11)
RDW: 14.3 % (ref 11.5–15.5)
WBC: 9.7 10*3/uL (ref 4.0–10.5)

## 2015-07-24 LAB — COMPLETE METABOLIC PANEL WITH GFR
ALT: 24 U/L (ref 6–29)
AST: 46 U/L — ABNORMAL HIGH (ref 10–35)
Albumin: 4.2 g/dL (ref 3.6–5.1)
Alkaline Phosphatase: 111 U/L (ref 33–115)
BILIRUBIN TOTAL: 1 mg/dL (ref 0.2–1.2)
BUN: 8 mg/dL (ref 7–25)
CALCIUM: 9.1 mg/dL (ref 8.6–10.2)
CHLORIDE: 98 mmol/L (ref 98–110)
CO2: 25 mmol/L (ref 20–31)
CREATININE: 0.69 mg/dL (ref 0.50–1.10)
GFR, Est African American: >89 mL/min (ref >=60)
GFR, Est Non African American: >89 mL/min (ref >=60)
Glucose, Bld: 95 mg/dL (ref 65–99)
Potassium: 3.6 mmol/L (ref 3.5–5.3)
Sodium: 134 mmol/L — ABNORMAL LOW (ref 135–146)
TOTAL PROTEIN: 7 g/dL (ref 6.1–8.1)

## 2015-07-24 LAB — TSH: TSH: 0.922 u[IU]/mL (ref 0.350–4.500)

## 2015-07-24 MED ORDER — ELUXADOLINE 75 MG PO TABS: 1.0000 | ORAL_TABLET | Freq: Two times a day (BID) | ORAL | Status: DC

## 2015-07-24 NOTE — Patient Instructions (Signed)
Please complete blood work and stool studies. We will call with the results.  Start taking a probiotic daily such as Align, Restora, Digestive Advantage, [facility], [facility] brand  I have printed a Viberzi prescription and given a copay card to take to your pharmacy for one month free.   After review of everything, we will set you up for a colonoscopy.   I have provided a letter so you may be out an additional day and return to work on Thursday.

## 2015-07-24 NOTE — Progress Notes (Signed)
Referring Provider: Glenda Chroman., MD Primary Care Physician:  Glenda Chroman., MD  Primary GI: Dr. Oneida Alar   Chief Complaint  Patient presents with  . Irritable Bowel Syndrome    HPI:   Rhonda Patel is a 50 y.o. female presenting today with a history of likely IBS-D. Diarrhea started in May 2016, intermittently. Has worsened.   Over past weekend, had breakfast, then had a "flare" of IBS that lasted all weekend. Flare described as abdominal cramping and going to the bathroom constantly. No rectal bleeding. She feels almost like if she doesn't eat, it will stop, but it didn't. Still was having symptoms on Monday. Second day out of work. Finally ate some kale greens last night. Had a few pieces of potato. Small amount of pork steak. Ate enough to coat her stomach. States symptoms have improved slightly since the weekend. States she can go days without eating. Has been off and on with Viberzi if she hasn't eaten anything. Has had poor oral intake for over a year now. Unsure if it is stress related or not. Thinks she may be distracted by other things and not eat. No abdominal pain with eating. Feels like if she won't eat, she won't be running to the bathroom. Managing reflux symptoms with Nexium. Viberzi 100 mg made her feel sleepy. She has been just using the samples here and there.   Baseline for patient will be 2  dozen loose stools a day. Drinks a vodka and cranberry about 3-4 times week.   No sick contacts. No recent antibiotic exposure. [place]. Didn't pay attention to if she had fever or chills. Doesn't think she did. No N/V. No prior colonoscopy. Feels more anxious than normal. Some depression. Beginning and end of year is difficult at work, very busy.   Past Medical History  Diagnosis Date  . SVT (supraventricular tachycardia) ([place])   . Asthma   . Hypertension     Past Surgical History  Procedure Laterality Date  . Cesarean section    . Knee surgery      right     Current Outpatient Prescriptions  Medication Sig Dispense Refill  . diltiazem (CARDIZEM) 30 MG tablet Take 1 tablet (30 mg total) by mouth 2 (two) times daily. 60 tablet 6  . esomeprazole (NEXIUM) 40 MG capsule Take 40 mg by mouth daily at 12 noon.    Marland Kitchen lisinopril-hydrochlorothiazide (PRINZIDE,ZESTORETIC) 10-12.5 MG tablet Take 1 tablet by mouth daily.    . Eluxadoline (VIBERZI) 75 MG TABS Take 1 tablet by mouth 2 (two) times daily with a meal. 60 tablet 3   No current facility-administered medications for this visit.    Allergies as of 07/24/2015 - Review Complete 06/14/2015  Allergen Reaction Noted  . Beta adrenergic blockers  02/25/2013  . Metoprolol Other (See Comments) 05/30/2013    Family History  Problem Relation Age of Onset  . Heart attack      grandfather  . Colon cancer Neg Hx   . Colon polyps Neg Hx   . Celiac disease Neg Hx   . Pancreatic cancer Neg Hx   . Stomach cancer Neg Hx   . Ulcerative colitis Neg Hx   . Crohn's disease Neg Hx     Social History   Social History  . Marital Status: Single    Spouse Name: N/A  . Number of Children: N/A  . Years of Education: N/A   Occupational History  . [employer]  Social History Main Topics  . Smoking status: Current Every Day Smoker -- 0.12 packs/day for 13 years    Types: Cigarettes    Start date: 06/30/2000  . Smokeless tobacco: Never Used  . Alcohol Use: 0.0 oz/week    0 Standard drinks or equivalent per week     Comment: Drinks at least 3-4 times a week, vodka and cranberry, normally after work   . Drug Use: No  . Sexual Activity: Not on file   Other Topics Concern  . Not on file   Social History Narrative    Review of Systems: Gen: see HPI  CV: Denies chest pain, palpitations, syncope, peripheral edema, and claudication. Resp: mild wheezing lately  GI: see HPI  Derm: Denies rash, itching, dry skin Psych: see HPI  Heme: Denies bruising, bleeding, and enlarged lymph nodes.  Physical  Exam: BP 192/106 mmHg  Pulse 80  Temp(Src) 97.8 F (36.6 C) (Oral)  Ht 5\' 4"  (1.626 m)  Wt 160 lb 6.4 oz (72.757 kg)  BMI 27.52 kg/m2 General:   Alert and oriented. No distress noted. Pleasant and cooperative.  Head:  Normocephalic and atraumatic. Eyes:  Conjuctiva clear without scleral icterus. Mouth:  Oral mucosa pink and moist. Good dentition. No lesions. Heart:  S1, S2 present without murmurs, rubs, or gallops. Regular rate and rhythm. Abdomen:  +BS, soft, non-tender and non-distended. No rebound or guarding. No HSM or masses noted. Msk:  Symmetrical without gross deformities. Normal posture. Extremities:  Without edema. Neurologic:  Alert and  oriented x4;  grossly normal neurologically. Psych:  Alert and cooperative. Normal mood and affect.

## 2015-07-25 ENCOUNTER — Other Ambulatory Visit: Payer: Self-pay | Admitting: Gastroenterology

## 2015-07-25 LAB — IGA: IgA: 216 mg/dL (ref 69–380)

## 2015-07-26 LAB — CLOSTRIDIUM DIFFICILE BY PCR: Toxigenic C. Difficile by PCR: NOT DETECTED

## 2015-07-26 LAB — TISSUE TRANSGLUTAMINASE, IGA: Tissue Transglutaminase Ab, IgA: 1 U/mL (ref <4)

## 2015-07-29 LAB — STOOL CULTURE

## 2015-07-30 LAB — GIARDIA ANTIGEN

## 2015-07-31 NOTE — Assessment & Plan Note (Signed)
50 year old female with likely IBS-D, presenting with acute on chronic diarrhea. No prior colonoscopy. Has done well on lower dose Viberzi at 75 mg BID, but she is not taking currently. Due to worsening of symptoms, check stool studies, labs, and then resume Viberzi. Probiotic recommended daily. Will ultimately need colonoscopy with Dr. Oneida Alar in the near future for routine screening purposes. Further recommendations to follow.

## 2015-08-01 NOTE — Progress Notes (Signed)
CC'D TO PCP °

## 2015-08-02 NOTE — Progress Notes (Signed)
Quick Note:  Cdiff PCR negative. TSH normal, celiac serologies negative, AST mildly elevated at 46 likely secondary to ETOH use, no anemia but platelets elevated at 471. She may resume Viberzi. We need to set her up for a screening colonoscopy with Dr. Oneida Alar with Phenergan 25 mg IV on call. ______

## 2015-08-03 NOTE — Progress Notes (Signed)
Quick Note:  Pt returned call and was informed and Ok to schedule the colonoscopy. ______

## 2015-08-03 NOTE — Progress Notes (Signed)
Quick Note:  LMOM to call. ______ 

## 2015-08-06 ENCOUNTER — Other Ambulatory Visit: Payer: Self-pay

## 2015-08-06 DIAGNOSIS — Z1211 Encounter for screening for malignant neoplasm of colon: Secondary | ICD-10-CM

## 2015-08-06 MED ORDER — PEG 3350-KCL-NA BICARB-NACL 420 G PO SOLR: 4000.0000 mL | Freq: Once | ORAL | Status: DC

## 2015-08-16 ENCOUNTER — Telehealth: Payer: Self-pay

## 2015-08-16 NOTE — Telephone Encounter (Signed)
Pt left a Vm stating that she will possible be on her menses on 08/20/2015 when she is scheduled for the colonoscopy. She wanted to know if she needed to reschedule. I called and told her that is fine, and it will not interfere with the procedure at all. She is fine with that.

## 2015-08-16 NOTE — Telephone Encounter (Signed)
REVIEWED-NO ADDITIONAL RECOMMENDATIONS. 

## 2015-08-20 ENCOUNTER — Ambulatory Visit (HOSPITAL_COMMUNITY)
Admission: RE | Admit: 2015-08-20 | Discharge: 2015-08-20 | Disposition: A | Discharge status: Home / Self Care | Payer: 59 | Source: Ambulatory Visit | Attending: Gastroenterology | Admitting: Gastroenterology

## 2015-08-20 ENCOUNTER — Encounter (HOSPITAL_COMMUNITY): Payer: Self-pay | Admitting: *Deleted

## 2015-08-20 ENCOUNTER — Encounter (HOSPITAL_COMMUNITY)
Admission: RE | Disposition: A | Discharge status: Home / Self Care | Payer: Self-pay | Source: Ambulatory Visit | Attending: Gastroenterology

## 2015-08-20 DIAGNOSIS — K5730 Diverticulosis of large intestine without perforation or abscess without bleeding: Secondary | ICD-10-CM | POA: Diagnosis not present

## 2015-08-20 DIAGNOSIS — I471 Supraventricular tachycardia: Secondary | ICD-10-CM | POA: Insufficient documentation

## 2015-08-20 DIAGNOSIS — K648 Other hemorrhoids: Secondary | ICD-10-CM | POA: Insufficient documentation

## 2015-08-20 DIAGNOSIS — Z1211 Encounter for screening for malignant neoplasm of colon: Secondary | ICD-10-CM

## 2015-08-20 DIAGNOSIS — R197 Diarrhea, unspecified: Secondary | ICD-10-CM

## 2015-08-20 DIAGNOSIS — J45909 Unspecified asthma, uncomplicated: Secondary | ICD-10-CM | POA: Insufficient documentation

## 2015-08-20 DIAGNOSIS — K621 Rectal polyp: Secondary | ICD-10-CM | POA: Diagnosis not present

## 2015-08-20 DIAGNOSIS — Z79899 Other long term (current) drug therapy: Secondary | ICD-10-CM | POA: Insufficient documentation

## 2015-08-20 DIAGNOSIS — K635 Polyp of colon: Secondary | ICD-10-CM | POA: Diagnosis not present

## 2015-08-20 DIAGNOSIS — D123 Benign neoplasm of transverse colon: Secondary | ICD-10-CM | POA: Diagnosis not present

## 2015-08-20 DIAGNOSIS — I10 Essential (primary) hypertension: Secondary | ICD-10-CM | POA: Insufficient documentation

## 2015-08-20 DIAGNOSIS — F17210 Nicotine dependence, cigarettes, uncomplicated: Secondary | ICD-10-CM | POA: Diagnosis not present

## 2015-08-20 HISTORY — PX: COLONOSCOPY: SHX5424

## 2015-08-20 SURGERY — COLONOSCOPY
Anesthesia: Moderate Sedation

## 2015-08-20 MED ORDER — PROMETHAZINE HCL 25 MG/ML IJ SOLN
INTRAMUSCULAR | Status: AC
  Filled 2015-08-20: qty 1

## 2015-08-20 MED ORDER — MIDAZOLAM HCL 5 MG/5ML IJ SOLN
INTRAMUSCULAR | Status: DC | PRN
  Administered 2015-08-20 (×2): 2 mg via INTRAVENOUS
  Administered 2015-08-20: 1 mg via INTRAVENOUS

## 2015-08-20 MED ORDER — PROMETHAZINE HCL 25 MG/ML IJ SOLN
25.0000 mg | Freq: Once | INTRAMUSCULAR | Status: AC
  Administered 2015-08-20: 25 mg via INTRAVENOUS

## 2015-08-20 MED ORDER — MIDAZOLAM HCL 5 MG/5ML IJ SOLN
INTRAMUSCULAR | Status: AC
  Filled 2015-08-20: qty 10

## 2015-08-20 MED ORDER — SODIUM CHLORIDE 0.9% FLUSH
INTRAVENOUS | Status: AC
  Filled 2015-08-20: qty 10

## 2015-08-20 MED ORDER — MEPERIDINE HCL 100 MG/ML IJ SOLN
INTRAMUSCULAR | Status: AC
  Filled 2015-08-20: qty 2

## 2015-08-20 MED ORDER — MEPERIDINE HCL 100 MG/ML IJ SOLN
INTRAMUSCULAR | Status: DC | PRN
  Administered 2015-08-20: 50 mg via INTRAVENOUS
  Administered 2015-08-20: 25 mg via INTRAVENOUS

## 2015-08-20 MED ORDER — SODIUM CHLORIDE 0.9 % IV SOLN
INTRAVENOUS | Status: DC
Start: 2015-08-20 — End: 2015-08-20
  Administered 2015-08-20: 10:00:00 via INTRAVENOUS

## 2015-08-20 NOTE — Progress Notes (Signed)
REVIEWED-NO ADDITIONAL RECOMMENDATIONS. 

## 2015-08-20 NOTE — H&P (Addendum)
Primary Care Physician:  Glenda Chroman., MD Primary Gastroenterologist:  Dr. Oneida Alar  Pre-Procedure History & Physical: HPI:  Rhonda Patel is a 50 y.o. female here for DIARRHEA.  Past Medical History  Diagnosis Date  . SVT (supraventricular tachycardia) ([place])   . Asthma   . Hypertension     Past Surgical History  Procedure Laterality Date  . Cesarean section    . Knee surgery      right    Prior to Admission medications   Medication Sig Start Date End Date Taking? Authorizing Provider  clonazePAM (KLONOPIN) 0.5 MG tablet Take 0.5 mg by mouth 2 (two) times daily as needed. anxiety 07/25/15  Yes Historical Provider, MD  diltiazem (CARDIZEM) 30 MG tablet Take 1 tablet (30 mg total) by mouth 2 (two) times daily. 05/30/13  Yes Herminio Commons, MD  Eluxadoline (VIBERZI) 75 MG TABS Take 1 tablet by mouth 2 (two) times daily with a meal. 07/24/15  Yes Orvil Feil, NP  esomeprazole (NEXIUM) 40 MG capsule Take 40 mg by mouth daily.    Yes Historical Provider, MD  ibuprofen (ADVIL,MOTRIN) 800 MG tablet Take 800 mg by mouth every 8 (eight) hours as needed. pain 06/06/15  Yes Historical Provider, MD  lisinopril-hydrochlorothiazide (PRINZIDE,ZESTORETIC) 20-12.5 MG tablet Take 1 tablet by mouth daily.   Yes Historical Provider, MD  polyethylene glycol-electrolytes (NULYTELY/GOLYTELY) 420 g solution Take 4,000 mLs by mouth once. 08/06/15  Yes Orvil Feil, NP    Allergies as of 08/06/2015 - Review Complete 07/31/2015  Allergen Reaction Noted  . Beta adrenergic blockers  02/25/2013  . Metoprolol Other (See Comments) 05/30/2013    Family History  Problem Relation Age of Onset  . Heart attack      grandfather  . Colon cancer Neg Hx   . Colon polyps Neg Hx   . Celiac disease Neg Hx   . Pancreatic cancer Neg Hx   . Stomach cancer Neg Hx   . Ulcerative colitis Neg Hx   . Crohn's disease Neg Hx     Social History   Social History  . Marital Status: Single    Spouse Name: N/A  . Number  of Children: N/A  . Years of Education: N/A   Occupational History  . [employer]    Social History Main Topics  . Smoking status: Current Every Day Smoker -- 0.12 packs/day for 13 years    Types: Cigarettes    Start date: 06/30/2000  . Smokeless tobacco: Never Used  . Alcohol Use: 0.0 oz/week    0 Standard drinks or equivalent per week     Comment: Drinks at least 3-4 times a week, vodka and cranberry, normally after work   . Drug Use: No  . Sexual Activity: Not on file   Other Topics Concern  . Not on file   Social History Narrative    Review of Systems: See HPI, otherwise negative ROS   Physical Exam: BP 121/73 mmHg  Pulse 83  Temp(Src) 98.2 F (36.8 C) (Oral)  Resp 18  Ht 5' 4.75" (1.645 m)  Wt 163 lb (73.936 kg)  BMI 27.32 kg/m2  SpO2 98%  LMP 08/14/2015 (Exact Date) General:   Alert,  pleasant and cooperative in NAD Head:  Normocephalic and atraumatic. Neck:  Supple; Lungs:  Clear throughout to auscultation.    Heart:  Regular rate and rhythm. Abdomen:  Soft, nontender and nondistended. Normal bowel sounds, without guarding, and without rebound.   Neurologic:  Alert and  oriented x4;  grossly normal neurologically.  Impression/Plan:    DIARRHEA  Plan:  1. ILEOTCS WITH BIOPSY TODAY

## 2015-08-20 NOTE — Discharge Instructions (Signed)
You had 2 polyps removed. You have small internal hemorrhoids and diverticulosis IN YOUR RIGHT COLON.  I BIOPSIED YOUR COLON.   FOLLOW A HIGH FIBER DIET. AVOID ITEMS THAT CAUSE BLOATING. SEE INFO BELOW.  YOUR BIOPSY RESULTS WILL BE AVAILABLE IN MY CHART FEB 23 AND MY OFFICE WILL CONTACT YOU IN 10-14 DAYS WITH YOUR RESULTS.   Next colonoscopy in 5-10 years.   Colonoscopy Care After Read the instructions outlined below and refer to this sheet in the next week. These discharge instructions provide you with general information on caring for yourself after you leave the hospital. While your treatment has been planned according to the most current medical practices available, unavoidable complications occasionally occur. If you have any problems or questions after discharge, call DR. FIELDS, [phone].  ACTIVITY  You may resume your regular activity, but move at a slower pace for the next 24 hours.   Take frequent rest periods for the next 24 hours.   Walking will help get rid of the air and reduce the bloated feeling in your belly (abdomen).   No driving for 24 hours (because of the medicine (anesthesia) used during the test).   You may shower.   Do not sign any important legal documents or operate any machinery for 24 hours (because of the anesthesia used during the test).    NUTRITION  Drink plenty of fluids.   You may resume your normal diet as instructed by your doctor.   Begin with a light meal and progress to your normal diet. Heavy or fried foods are harder to digest and may make you feel sick to your stomach (nauseated).   Avoid alcoholic beverages for 24 hours or as instructed.    MEDICATIONS  You may resume your normal medications.   WHAT YOU CAN EXPECT TODAY  Some feelings of bloating in the abdomen.   Passage of more gas than usual.   Spotting of blood in your stool or on the toilet paper  .  IF YOU HAD POLYPS REMOVED DURING THE COLONOSCOPY:  Eat a  soft diet IF YOU HAVE NAUSEA, BLOATING, ABDOMINAL PAIN, OR VOMITING.    FINDING OUT THE RESULTS OF YOUR TEST Not all test results are available during your visit. DR. Oneida Alar WILL CALL YOU WITHIN 14 DAYS OF YOUR PROCEDUE WITH YOUR RESULTS. Do not assume everything is normal if you have not heard from DR. FIELDS, CALL HER OFFICE AT [phone].  SEEK IMMEDIATE MEDICAL ATTENTION AND CALL THE OFFICE: [phone] IF:  You have more than a spotting of blood in your stool.   Your belly is swollen (abdominal distention).   You are nauseated or vomiting.   You have a temperature over 101F.   You have abdominal pain or discomfort that is severe or gets worse throughout the day.  Polyps, Colon  A polyp is extra tissue that grows inside your body. Colon polyps grow in the large intestine. The large intestine, also called the colon, is part of your digestive system. It is a long, hollow tube at the end of your digestive tract where your body makes and stores stool. Most polyps are not dangerous. They are benign. This means they are not cancerous. But over time, some types of polyps can turn into cancer. Polyps that are smaller than a pea are usually not harmful. But larger polyps could someday become or may already be cancerous. To be safe, doctors remove all polyps and test them.   PREVENTION There is not one sure  way to prevent polyps. You might be able to lower your risk of getting them if you:  Eat more fruits and vegetables and less fatty food.   Do not smoke.   Avoid alcohol.   Exercise every day.   Lose weight if you are overweight.   Eating more calcium and folate can also lower your risk of getting polyps. Some foods that are rich in calcium are milk, cheese, and broccoli. Some foods that are rich in folate are chickpeas, kidney beans, and spinach.   High-Fiber Diet A high-fiber diet changes your normal diet to include more whole grains, legumes, fruits, and vegetables. Changes in  the diet involve replacing refined carbohydrates with unrefined foods. The calorie level of the diet is essentially unchanged. The Dietary Reference Intake (recommended amount) for adult males is 38 grams per day. For adult females, it is 25 grams per day. Pregnant and lactating women should consume 28 grams of fiber per day. Fiber is the intact part of a plant that is not broken down during digestion. Functional fiber is fiber that has been isolated from the plant to provide a beneficial effect in the body. PURPOSE  Increase stool bulk.   Ease and regulate bowel movements.   Lower cholesterol.  REDUCE RISK OF COLON CANCER  INDICATIONS THAT YOU NEED MORE FIBER  Constipation and hemorrhoids.   Uncomplicated diverticulosis (intestine condition) and irritable bowel syndrome.   Weight management.   As a protective measure against hardening of the arteries (atherosclerosis), diabetes, and cancer.   GUIDELINES FOR INCREASING FIBER IN THE DIET  Start adding fiber to the diet slowly. A gradual increase of about 5 more grams (2 slices of whole-wheat bread, 2 servings of most fruits or vegetables, or 1 bowl of high-fiber cereal) per day is best. Too rapid an increase in fiber may result in constipation, flatulence, and bloating.   Drink enough water and fluids to keep your urine clear or pale yellow. Water, juice, or caffeine-free drinks are recommended. Not drinking enough fluid may cause constipation.   Eat a variety of high-fiber foods rather than one type of fiber.   Try to increase your intake of fiber through using high-fiber foods rather than fiber pills or supplements that contain small amounts of fiber.   The goal is to change the types of food eaten. Do not supplement your present diet with high-fiber foods, but replace foods in your present diet.   INCLUDE A VARIETY OF FIBER SOURCES  Replace refined and processed grains with whole grains, canned fruits with fresh fruits, and  incorporate other fiber sources. White rice, white breads, and most bakery goods contain little or no fiber.   Brown whole-grain rice, buckwheat oats, and many fruits and vegetables are all good sources of fiber. These include: broccoli, Brussels sprouts, cabbage, cauliflower, beets, sweet potatoes, white potatoes (skin on), carrots, tomatoes, eggplant, squash, berries, fresh fruits, and dried fruits.   Cereals appear to be the richest source of fiber. Cereal fiber is found in whole grains and bran. Bran is the fiber-rich outer coat of cereal grain, which is largely removed in refining. In whole-grain cereals, the bran remains. In breakfast cereals, the largest amount of fiber is found in those with "bran" in their names. The fiber content is sometimes indicated on the label.   You may need to include additional fruits and vegetables each day.   In baking, for 1 cup white flour, you may use the following substitutions:   1 cup whole-wheat  flour minus 2 tablespoons.   1/2 cup white flour plus 1/2 cup whole-wheat flour.   Diverticulosis Diverticulosis is a common condition that develops when small pouches (diverticula) form in the wall of the colon. The risk of diverticulosis increases with age. It happens more often in people who eat a low-fiber diet. Most individuals with diverticulosis have no symptoms. Those individuals with symptoms usually experience belly (abdominal) pain, constipation, or loose stools (diarrhea).  HOME CARE INSTRUCTIONS  Increase the amount of fiber in your diet as directed by your caregiver or dietician. This may reduce symptoms of diverticulosis.   Drink at least 6 to 8 glasses of water each day to prevent constipation.   Try not to strain when you have a bowel movement.   Avoiding nuts and seeds to prevent complications is NOT NECESSARY.   FOODS HAVING HIGH FIBER CONTENT INCLUDE:  Fruits. Apple, peach, pear, tangerine, raisins, prunes.   Vegetables. Brussels  sprouts, asparagus, broccoli, cabbage, carrot, cauliflower, romaine lettuce, spinach, summer squash, tomato, winter squash, zucchini.   Starchy Vegetables. Baked beans, kidney beans, lima beans, split peas, lentils, potatoes (with skin).   Grains. Whole wheat bread, brown rice, bran flake cereal, plain oatmeal, white rice, shredded wheat, bran muffins.   SEEK IMMEDIATE MEDICAL CARE IF:  You develop increasing pain or severe bloating.   You have an oral temperature above 101F.   You develop vomiting or bowel movements that are bloody or black.   Hemorrhoids Hemorrhoids are dilated (enlarged) veins around the rectum. Sometimes clots will form in the veins. This makes them swollen and painful. These are called thrombosed hemorrhoids. Causes of hemorrhoids include:  Constipation.   Straining to have a bowel movement.   HEAVY LIFTING  HOME CARE INSTRUCTIONS  Eat a well balanced diet and drink 6 to 8 glasses of water every day to avoid constipation. You may also use a bulk laxative.   Avoid straining to have bowel movements.   Keep anal area dry and clean.   Do not use a donut shaped pillow or sit on the toilet for long periods. This increases blood pooling and pain.   Move your bowels when your body has the urge; this will require less straining and will decrease pain and pressure.    PATIENT INSTRUCTIONS POST-ANESTHESIA  IMMEDIATELY FOLLOWING SURGERY:  Do not drive or operate machinery for the first twenty four hours after surgery.  Do not make any important decisions for twenty four hours after surgery or while taking narcotic pain medications or sedatives.  If you develop intractable nausea and vomiting or a severe headache please notify your doctor immediately.  FOLLOW-UP:  Please make an appointment with your surgeon as instructed. You do not need to follow up with anesthesia unless specifically instructed to do so.  WOUND CARE INSTRUCTIONS (if applicable):  Keep a dry  clean dressing on the anesthesia/puncture wound site if there is drainage.  Once the wound has quit draining you may leave it open to air.  Generally you should leave the bandage intact for twenty four hours unless there is drainage.  If the epidural site drains for more than 36-48 hours please call the anesthesia department.  QUESTIONS?:  Please feel free to call your physician or the hospital operator if you have any questions, and they will be happy to assist you.

## 2015-08-20 NOTE — Op Note (Addendum)
[facility] [address] [place], [address]   COLONOSCOPY PROCEDURE REPORT  PATIENT: Rhonda Patel, Rhonda Patel  MR#: [id number] BIRTHDATE: 01/16/1966 , 4  yrs. old GENDER: female ENDOSCOPIST: Danie Binder, MD REFERRED [id number] Woody Seller, M.D. PROCEDURE DATE:  September 03, 2015 PROCEDURE:   Colonoscopy with cold biopsy polypectomy and Colonoscopy with snare polypectomy INDICATIONS:chronic diarrhea and unexplained diarrhea. MEDICATIONS: Promethazine (Phenergan) 25 mg IV, Demerol 75 mg IV, and Versed 5 mg IV  MD started sedation: 1058. Procedure complete: 1132  DESCRIPTION OF PROCEDURE:    Physical exam was performed.  Informed consent was obtained from the patient after explaining the benefits, risks, and alternatives to procedure.  The patient was connected to monitor and placed in left lateral position. Continuous oxygen was provided by nasal cannula and IV medicine administered through an indwelling cannula.  After administration of sedation and rectal exam, the patients rectum was intubated and the [id number]  colonoscope was advanced under direct visualization to the cecum.  The scope was removed slowly by carefully examining the color, texture, anatomy, and integrity mucosa on the way out.  The patient was recovered in endoscopy and discharged home in satisfactory condition. Estimated blood loss is zero unless otherwise noted in this procedure report.    COLON FINDINGS: A pedunculated polyp measuring 6 mm in size was found in the transverse colon.  A polypectomy was performed using snare cautery.  , A sessile polyp ranging from 2 to 76mm in size was found in the rectum.  A polypectomy was performed with cold forceps.  , There was mild diverticulosis noted in the ascending colon and at the cecum.  , and Moderate sized internal hemorrhoids were found.  PREP QUALITY: excellent.  CECAL W/D TIME: 12       minutes COMPLICATIONS: None  ENDOSCOPIC IMPRESSION: 1.    THREE COLON POLYPS REMOVED 2.   Mild diverticulosis was noted in the ascending colon and at the cecum 3.   Moderate sized internal hemorrhoids  RECOMMENDATIONS: FOLLOW A HIGH FIBER DIET. AWAIT BIOPSY RESULTS. Next colonoscopy in 5-10 years.    _______________________________ Lorrin MaisDanie Binder, MD 09/03/15 8:15 PM Revised: 2015/09/03 8:15 PM   CPT CODES: ICD CODES:  The ICD and CPT codes recommended by this software are interpretations from the data that the clinical staff has captured with the software.  The verification of the translation of this report to the ICD and CPT codes and modifiers is the sole responsibility of the health care institution and practicing physician where this report was generated.  [place]. will not be held responsible for the validity of the ICD and CPT codes included on this report.  AMA assumes no liability for data contained or not contained herein. C[employer] is a Designer, television/film set of the [employer].

## 2015-08-23 ENCOUNTER — Encounter (HOSPITAL_COMMUNITY): Payer: Self-pay | Admitting: Gastroenterology

## 2015-08-28 ENCOUNTER — Telehealth: Payer: Self-pay | Admitting: Gastroenterology

## 2015-08-28 NOTE — Telephone Encounter (Signed)
Please call pt. She had ONE HYPERPLASTIC AND ONE INFLAMED POLYP removed.  FOLLOW A HIGH FIBER DIET. AVOID ITEMS THAT CAUSE BLOATING.  Next colonoscopy in 10 years.

## 2015-08-28 NOTE — Telephone Encounter (Signed)
ON RECALL  °

## 2015-08-29 NOTE — Telephone Encounter (Signed)
LMOM to call.

## 2015-08-30 NOTE — Telephone Encounter (Signed)
Letter mailed to call.  

## 2015-09-03 NOTE — Telephone Encounter (Signed)
Pt is aware of results. 

## 2015-09-19 ENCOUNTER — Encounter: Payer: Self-pay | Admitting: Gastroenterology

## 2015-12-20 ENCOUNTER — Other Ambulatory Visit: Payer: Self-pay

## 2015-12-20 MED ORDER — ELUXADOLINE 75 MG PO TABS: 1.0000 | ORAL_TABLET | Freq: Two times a day (BID) | ORAL | Status: DC

## 2016-02-07 ENCOUNTER — Other Ambulatory Visit: Payer: Self-pay

## 2016-02-07 MED ORDER — ELUXADOLINE 75 MG PO TABS: 75.0000 mg | ORAL_TABLET | Freq: Two times a day (BID) | ORAL | 2 refills | Status: DC

## 2016-02-07 NOTE — Telephone Encounter (Signed)
Verified no past surgical history of cholecystectomy (gallbladder remains in situ)

## 2016-09-07 ENCOUNTER — Emergency Department (HOSPITAL_COMMUNITY)
Admission: EM | Admit: 2016-09-07 | Discharge: 2016-09-07 | Disposition: A | Discharge status: Home / Self Care | Payer: Worker's Compensation | Attending: Emergency Medicine | Admitting: Emergency Medicine

## 2016-09-07 ENCOUNTER — Encounter (HOSPITAL_COMMUNITY): Payer: Self-pay | Admitting: Emergency Medicine

## 2016-09-07 ENCOUNTER — Emergency Department (HOSPITAL_COMMUNITY): Payer: Worker's Compensation

## 2016-09-07 DIAGNOSIS — J45909 Unspecified asthma, uncomplicated: Secondary | ICD-10-CM | POA: Diagnosis not present

## 2016-09-07 DIAGNOSIS — I10 Essential (primary) hypertension: Secondary | ICD-10-CM | POA: Diagnosis not present

## 2016-09-07 DIAGNOSIS — Y9389 Activity, other specified: Secondary | ICD-10-CM | POA: Diagnosis not present

## 2016-09-07 DIAGNOSIS — Z79899 Other long term (current) drug therapy: Secondary | ICD-10-CM | POA: Diagnosis not present

## 2016-09-07 DIAGNOSIS — S0990XA Unspecified injury of head, initial encounter: Secondary | ICD-10-CM | POA: Diagnosis present

## 2016-09-07 DIAGNOSIS — F17210 Nicotine dependence, cigarettes, uncomplicated: Secondary | ICD-10-CM | POA: Diagnosis not present

## 2016-09-07 DIAGNOSIS — Y999 Unspecified external cause status: Secondary | ICD-10-CM | POA: Insufficient documentation

## 2016-09-07 DIAGNOSIS — Y929 Unspecified place or not applicable: Secondary | ICD-10-CM | POA: Diagnosis not present

## 2016-09-07 DIAGNOSIS — S0003XA Contusion of scalp, initial encounter: Secondary | ICD-10-CM | POA: Insufficient documentation

## 2016-09-07 DIAGNOSIS — W208XXA Other cause of strike by thrown, projected or falling object, initial encounter: Secondary | ICD-10-CM | POA: Insufficient documentation

## 2016-09-07 MED ORDER — NAPROXEN 500 MG PO TABS: 500.0000 mg | ORAL_TABLET | Freq: Two times a day (BID) | ORAL | 0 refills | Status: DC

## 2016-09-07 NOTE — Discharge Instructions (Signed)
Rest, follow-up with your primary doctor for recheck.  Return to ER for any worsening symtpoms

## 2016-09-07 NOTE — ED Triage Notes (Addendum)
Pt c/o head pain after some plates were dropped on her head last night while working. Pt c/o nausea, lightheadedness and irritability. Denies loc.

## 2016-09-07 NOTE — ED Provider Notes (Signed)
[facility] DEPT Provider Note   CSN: [id number] Arrival date & time: 09/07/16  1444  By signing my name below, I, Collene Leyden, attest that this documentation has been prepared under the direction and in the presence of Genelle Economou PA-C.  Electronically Signed: Collene Leyden, Scribe. 09/07/16. 5:16 PM.  History   Chief Complaint Chief Complaint  Patient presents with  . Head Injury   HPI Comments: Rhonda Patel is a 51 y.o. female who presents to the Emergency Department complaining of a gradual-onset headache that began last night. Patient states a few wire baskets fell on her head at 8:30 PM on the evening prior to arrival. This is a work related injury.  Patient reports associated nausea, agitation, excessive tearing of her eyes,headache, and light-headedness. Patient describes the pain as shooting with a severity of 8/10. Patient reports taking ibuprofen with no relief in pain. Patient denies any loss of consciousness, back or neck pain, vomiting, dizziness, or visual changes  The history is provided by the patient. No language interpreter was used.    Past Medical History:  Diagnosis Date  . Asthma   . Hypertension   . SVT (supraventricular tachycardia) [facility])     Patient Active Problem List   Diagnosis Date Noted  . Encounter for screening colonoscopy   . IBS (irritable bowel syndrome) 05/03/2015  . GERD (gastroesophageal reflux disease) 05/03/2015  . PSVT (paroxysmal supraventricular tachycardia) ([place]) 03/28/2013  . Chest pain 03/28/2013    Past Surgical History:  Procedure Laterality Date  . CESAREAN SECTION    . COLONOSCOPY N/A 08/20/2015   Procedure: COLONOSCOPY;  Surgeon: Danie Binder, MD;  Location[facility];  Service: Endoscopy;  Laterality: N/A;  1045  . KNEE SURGERY     right    OB History    No data available       Home Medications    Prior to Admission medications   Medication Sig Start Date End Date Taking? Authorizing Provider    clonazePAM (KLONOPIN) 0.5 MG tablet Take 0.5 mg by mouth 2 (two) times daily as needed. anxiety 07/25/15   Historical Provider, MD  diltiazem (CARDIZEM) 30 MG tablet Take 1 tablet (30 mg total) by mouth 2 (two) times daily. 05/30/13   Herminio Commons, MD  Eluxadoline (VIBERZI) 75 MG TABS Take 75 mg by mouth 2 (two) times daily. 02/07/16   Carlis Stable, NP  esomeprazole (NEXIUM) 40 MG capsule Take 40 mg by mouth daily.     Historical Provider, MD  ibuprofen (ADVIL,MOTRIN) 800 MG tablet Take 800 mg by mouth every 8 (eight) hours as needed. pain 06/06/15   Historical Provider, MD  lisinopril-hydrochlorothiazide (PRINZIDE,ZESTORETIC) 20-12.5 MG tablet Take 1 tablet by mouth daily.    Historical Provider, MD  polyethylene glycol-electrolytes (NULYTELY/GOLYTELY) 420 g solution Take 4,000 mLs by mouth once. 08/06/15   Annitta Needs, NP    Family History Family History  Problem Relation Age of Onset  . Heart attack      grandfather  . Colon cancer Neg Hx   . Colon polyps Neg Hx   . Celiac disease Neg Hx   . Pancreatic cancer Neg Hx   . Stomach cancer Neg Hx   . Ulcerative colitis Neg Hx   . Crohn's disease Neg Hx     Social History Social History  Substance Use Topics  . Smoking status: Current Every Day Smoker    Packs/day: 0.12    Years: 13.00    Types: Cigarettes  Start date: 06/30/2000  . Smokeless tobacco: Never Used  . Alcohol use 0.0 oz/week     Comment: Drinks at least 3-4 times a week, vodka and cranberry, normally after work      Allergies   Beta adrenergic blockers and Metoprolol   Review of Systems Review of Systems  Constitutional: Positive for fatigue. Negative for appetite change.  Eyes: Negative for pain and visual disturbance.  Respiratory: Negative for shortness of breath.   Cardiovascular: Negative for chest pain.  Gastrointestinal: Positive for nausea. Negative for vomiting.  Musculoskeletal: Negative for back pain and neck pain.  Skin: Negative for wound.   Neurological: Positive for light-headedness and headaches. Negative for dizziness, syncope, facial asymmetry and speech difficulty.  Psychiatric/Behavioral: Positive for agitation. Negative for confusion.     Physical Exam Updated Vital Signs BP 163/91 (BP Location: Right Arm)   Pulse 78   Temp 97.8 F (36.6 C)   Resp 18   Ht 5' 4.75" (1.645 m)   Wt 150 lb (68 kg)   LMP 08/24/2016   SpO2 100%   BMI 25.15 kg/m   Physical Exam  Constitutional: She is oriented to person, place, and time. She appears well-developed.  HENT:  Head: Normocephalic and atraumatic.  Mouth/Throat: Oropharynx is clear and moist.  4 cm frontal scalp hematoma.   Eyes: Conjunctivae and EOM are normal. Pupils are equal, round, and reactive to light.  Neck: Normal range of motion. Neck supple.  Cardiovascular: Normal rate, regular rhythm and intact distal pulses.   Pulmonary/Chest: Effort normal and breath sounds normal.  Musculoskeletal: Normal range of motion.  Neurological: She is alert and oriented to person, place, and time. She has normal strength. No sensory deficit. Coordination and gait normal. GCS eye subscore is 4. GCS verbal subscore is 5. GCS motor subscore is 6.  CN II-XII grossly intact  Skin: Skin is warm and dry. No rash noted.  Psychiatric: She has a normal mood and affect.  Nursing note and vitals reviewed.    ED Treatments / Results  DIAGNOSTIC STUDIES: Oxygen Saturation is 100% on RA, normal by my interpretation.    COORDINATION OF CARE: 5:12 PM Discussed treatment plan with pt at bedside and pt agreed to plan, which includes a head CT.   Labs (all labs ordered are listed, but only abnormal results are displayed) Labs Reviewed - No data to display  EKG  EKG Interpretation None       Radiology Ct Head Wo Contrast  Result Date: 09/07/2016 CLINICAL DATA:  Right head injury EXAM: CT HEAD WITHOUT CONTRAST TECHNIQUE: Contiguous axial images were obtained from the base of the  skull through the vertex without intravenous contrast. COMPARISON:  None. FINDINGS: Brain: No evidence of acute infarction, hemorrhage, hydrocephalus, extra-axial collection or mass lesion/mass effect. Vascular: No hyperdense vessel or unexpected calcification. Skull: Normal. Negative for fracture or focal lesion. Sinuses/Orbits: No acute finding. Other: None. IMPRESSION: Normal head CT. Electronically Signed   By: Julian Hy M.D.   On: 09/07/2016 18:18    Procedures Procedures (including critical care time)  Medications Ordered in ED Medications - No data to display   Initial Impression / Assessment and Plan / ED Course  I have reviewed the triage vital signs and the nursing notes.  Pertinent labs & imaging results that were available during my care of the patient were reviewed by me and considered in my medical decision making (see chart for details).     Pt is well appearing, no focal  neuro deficits, mentating well.  Ambulates with a steady gait.  CT head neg.    Appears stable for d/c.  Discussed concussive symptoms and return precautions.  Pt verbalized understanding.    Final Clinical Impressions(s) / ED Diagnoses   Final diagnoses:  Minor head injury, initial encounter    New Prescriptions New Prescriptions   No medications on file   I personally performed the services described in this documentation, which was scribed in my presence. The recorded information has been reviewed and is accurate.     Kem Parkinson, PA-C 09/09/16 [place], DO 09/10/16 1554

## 2016-11-04 IMAGING — CT CT ABD-PELV W/ CM
2 of 5 series · 16 of 46 positions shown, 18 images · IV contrast (Omnipaque 300)
Comparison: None.

CLINICAL DATA: Worsening abdominal pain and diarrhea for 3 months.
20 lb weight loss in past 3 months.

EXAM:
CT ABDOMEN AND PELVIS WITH CONTRAST
TECHNIQUE: Multidetector CT imaging of the abdomen and pelvis was performed
using the standard protocol following bolus administration of
intravenous contrast.
CONTRAST:  100mL OMNIPAQUE IOHEXOL 300 MG/ML  SOLN

[Series 2: abd_pel_with 5.0 b40f · axial · 0.59mm/px · z∈[-406,-20]mm · 13 of 89 slices shown, 15 images]
[im 6/89  soft-tissue]
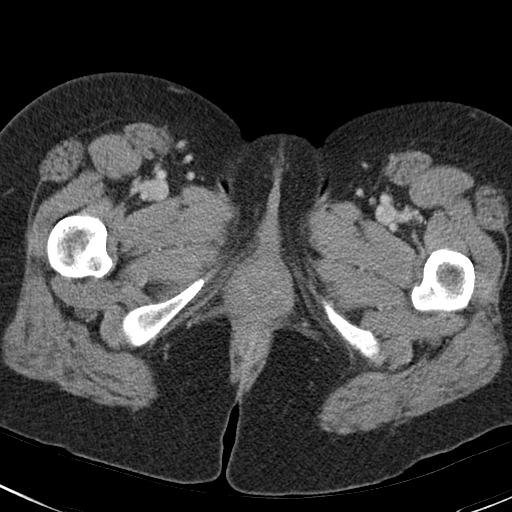
[im 6/89  bone]
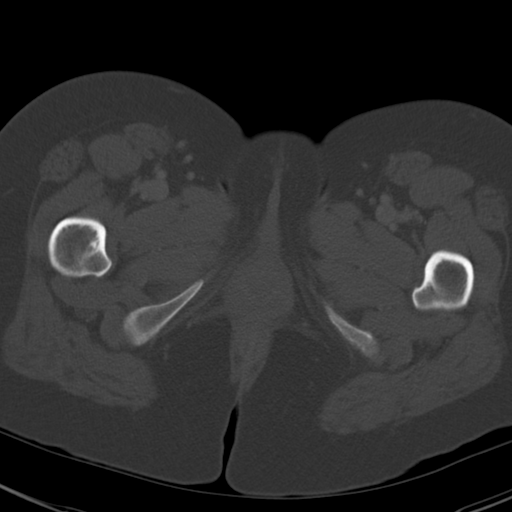
[im 11/89  soft-tissue]
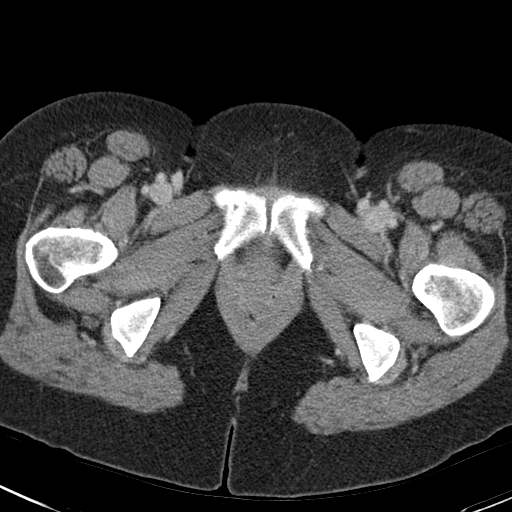
[im 21/89  soft-tissue]
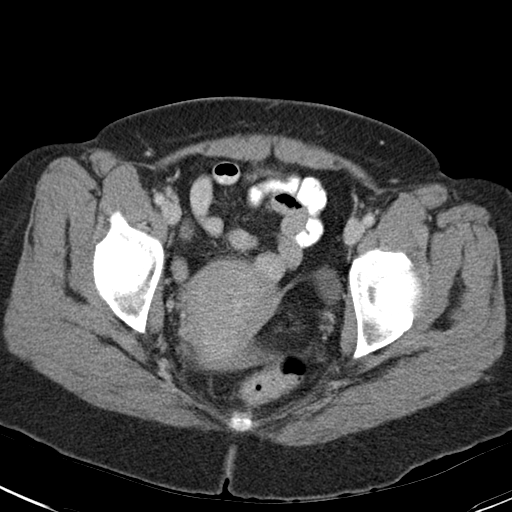
[im 26/89  soft-tissue]
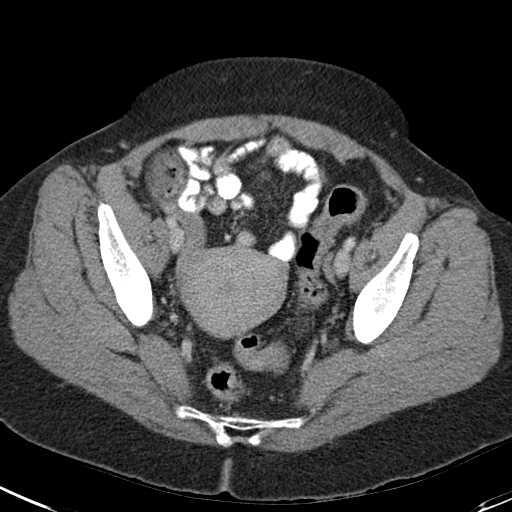
[im 32/89  soft-tissue]
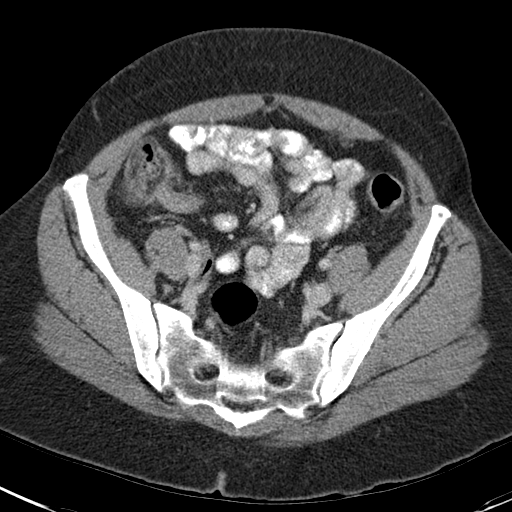
[im 37/89  soft-tissue]
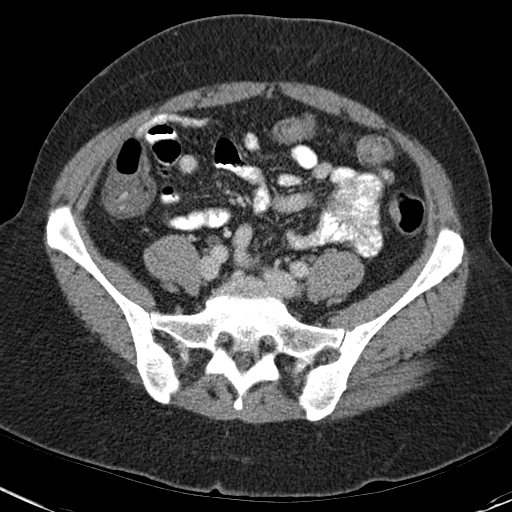
[im 47/89  soft-tissue]
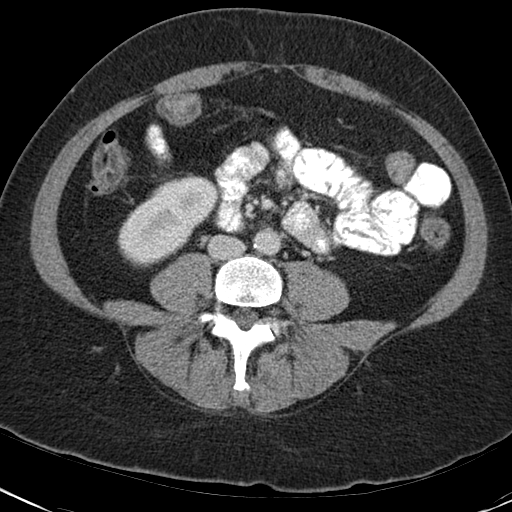
[im 52/89  soft-tissue]
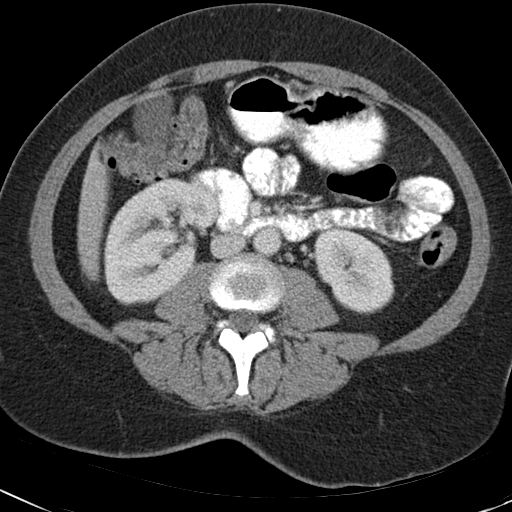
[im 57/89  soft-tissue]
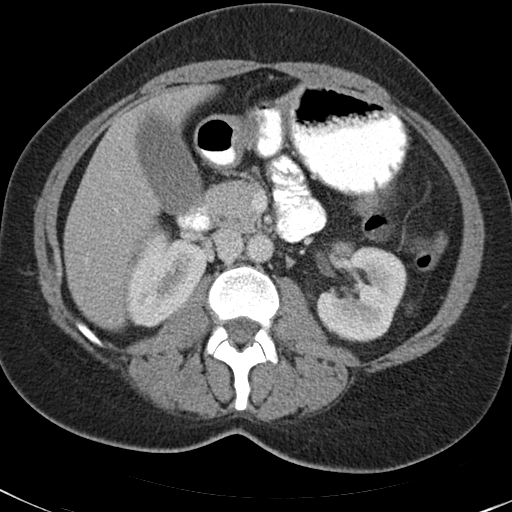
[im 57/89  bone]
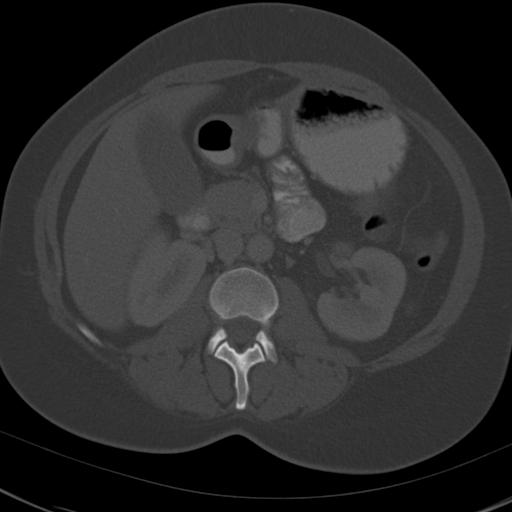
[im 63/89  soft-tissue]
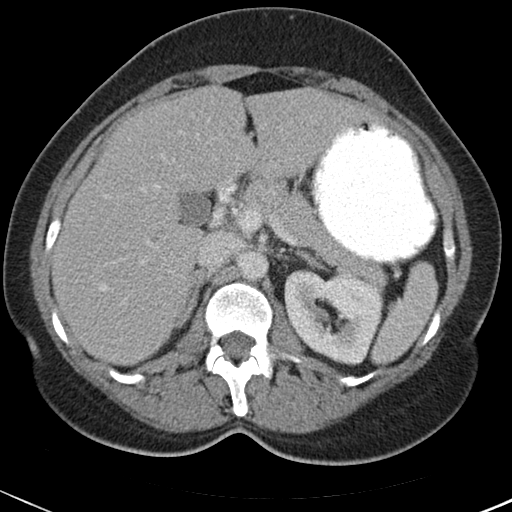
[im 68/89  soft-tissue]
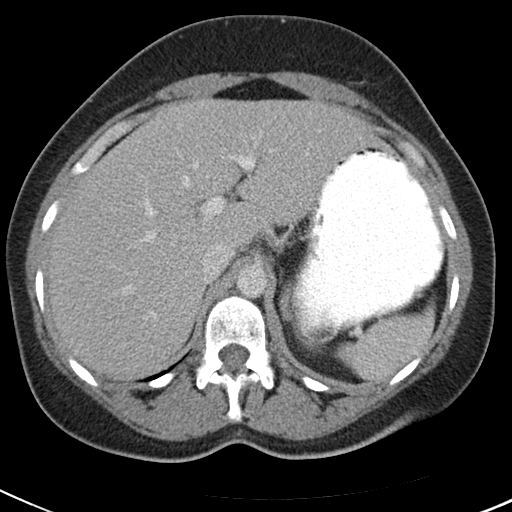
[im 78/89  soft-tissue]
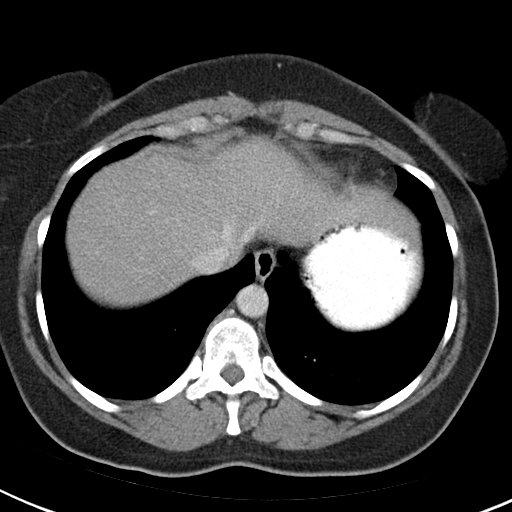
[im 83/89  soft-tissue]
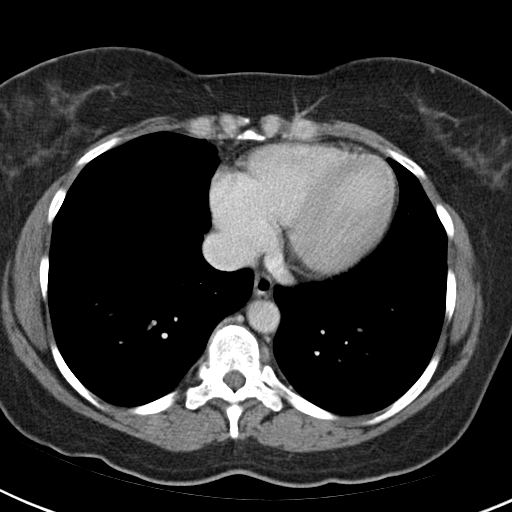

[Series 5: abd_pel_with 3.0 spo · coronal · 0.63mm/px · 3 of 91 slices shown]
[im 31/91  soft-tissue]
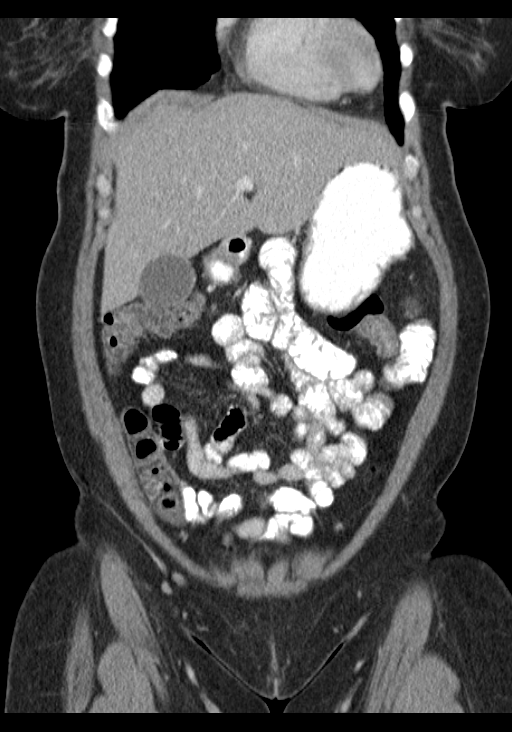
[im 41/91  soft-tissue]
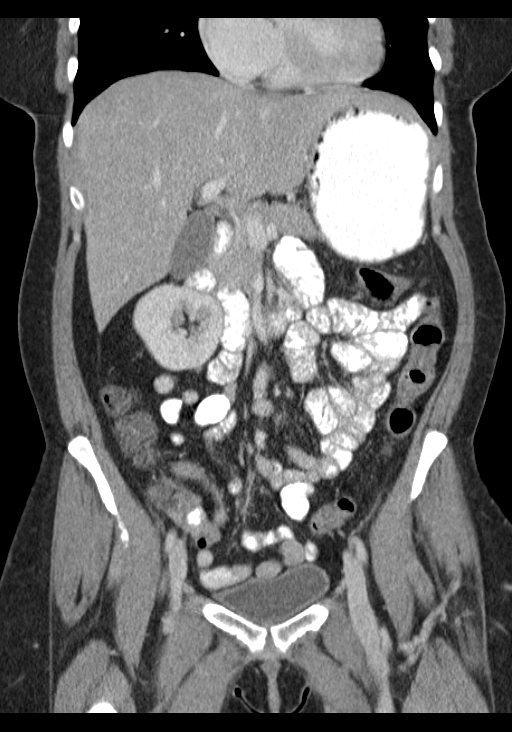
[im 51/91  soft-tissue]
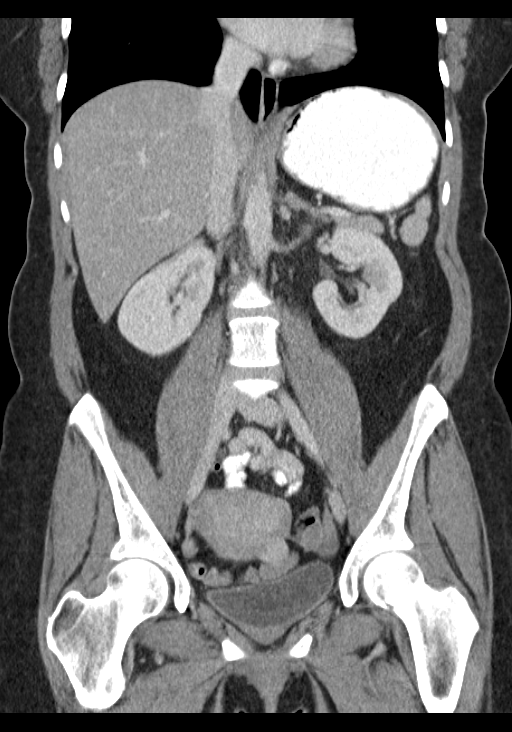

[16 of 46 positions shown; findings below may reference images not displayed]

FINDINGS: Lower chest:  No acute findings.

Hepatobiliary: No masses or other significant abnormality.
Gallbladder is unremarkable.

Pancreas: No mass, inflammatory changes, or other significant
abnormality.

Spleen: Within normal limits in size and appearance.

Adrenals/Urinary Tract: No masses identified. No evidence of
hydronephrosis.

Stomach/Bowel: No evidence of obstruction, inflammatory process, or
abnormal fluid collections. Normal appendix visualized.

Vascular/Lymphatic: No pathologically enlarged lymph nodes. No
evidence of abdominal aortic aneurysm.

Reproductive: Small uterine fibroids noted measuring up to 2 cm.
Adnexal regions are unremarkable.

Other: None.

Musculoskeletal:  No suspicious bone lesions identified.
IMPRESSION: No acute findings.

Small uterine fibroids measuring up to 2 cm.

## 2019-05-10 ENCOUNTER — Encounter: Payer: Self-pay | Admitting: *Deleted

## 2019-05-11 ENCOUNTER — Other Ambulatory Visit: Payer: Self-pay

## 2019-05-11 ENCOUNTER — Encounter: Payer: Self-pay | Admitting: Cardiovascular Disease

## 2019-05-11 ENCOUNTER — Telehealth: Payer: Self-pay | Admitting: Cardiovascular Disease

## 2019-05-11 ENCOUNTER — Ambulatory Visit (INDEPENDENT_AMBULATORY_CARE_PROVIDER_SITE_OTHER): Payer: BC Managed Care – PPO | Admitting: Cardiovascular Disease

## 2019-05-11 VITALS — BP 118/78 | HR 101 | Ht 64.0 in | Wt 145.0 lb

## 2019-05-11 DIAGNOSIS — I10 Essential (primary) hypertension: Secondary | ICD-10-CM

## 2019-05-11 DIAGNOSIS — R002 Palpitations: Secondary | ICD-10-CM

## 2019-05-11 DIAGNOSIS — I471 Supraventricular tachycardia: Secondary | ICD-10-CM

## 2019-05-11 DIAGNOSIS — K219 Gastro-esophageal reflux disease without esophagitis: Secondary | ICD-10-CM | POA: Diagnosis not present

## 2019-05-11 MED ORDER — DILTIAZEM HCL ER COATED BEADS 120 MG PO CP24: 120.0000 mg | ORAL_CAPSULE | Freq: Every day | ORAL | 6 refills | Status: DC

## 2019-05-11 NOTE — Progress Notes (Signed)
CARDIOLOGY CONSULT NOTE  Patient ID: Rhonda Patel MRN: [id number] DOB/AGE: [birth year] 53 y.o.  Admit date: (Not on file) Primary Physician: Glenda Chroman, MD  Reason for Consultation: SVT  HPI: Rhonda Patel is a 53 y.o. female who is being seen today for the evaluation of SVT at the request of Vyas, Dhruv B, MD.   I last saw her in December 2014.  She has a history of paroxysmal SVT.  She was recently evaluated in the ED at [facility]. Heart rate was reportedly in the 160 bpm range.  I reviewed all records in the EMR.  Potassium was mildly low at 3.4.  Renal and thyroid function were normal.  Hemoglobin was normal.  As per their documentation, EKG demonstrated sinus tachycardia at a rate of 150 bpm for which she was given IV diltiazem.  Heart rate initially decreased to the 110-120 bpm range but then resumed back to 150 bpm.  She was then given 5 mg of IV Lopressor and converted to sinus rhythm with a heart rate in the 60-70 bpm range.  ECG performed today which I ordered and personally reviewed demonstrates sinus tachycardia, 112 bpm.  She has been having more frequent palpitations lately.  She denies exertional chest pain but does describe bilateral inframammary tightness.  She does have IBS and describes some abdominal distention as well.  She denies nausea and vomiting.  She does have occasional dysphagia for solids.  She takes Nexium for GERD.  Palpitations can occur several times per week.  She denies orthopnea and paroxysmal nocturnal dyspnea.  She does have some feet swelling at the end of the day.  She sits for long periods of time at work.  She has had associated lightheadedness and dizziness with tachycardia and palpitations but denies frank syncope.  Social history: She is engaged.  She works at [employer] in the [employer] in [place].   Allergies  Allergen Reactions  . Beta Adrenergic Blockers     Makes her tired  . Metoprolol  Other (See Comments)    Patient cannot tolerate higher dose of 25 mg daily    Current Outpatient Medications  Medication Sig Dispense Refill  . diltiazem (CARDIZEM) 30 MG tablet Take 1 tablet (30 mg total) by mouth 2 (two) times daily. 60 tablet 6  . esomeprazole (NEXIUM) 40 MG capsule Take 40 mg by mouth daily.     Marland Kitchen ibuprofen (ADVIL,MOTRIN) 800 MG tablet Take 800 mg by mouth every 8 (eight) hours as needed. pain    . lisinopril-hydrochlorothiazide (PRINZIDE,ZESTORETIC) 20-12.5 MG tablet Take 1 tablet by mouth daily.     No current facility-administered medications for this visit.     Past Medical History:  Diagnosis Date  . Asthma   . Hypertension   . SVT (supraventricular tachycardia) ([facility])     Past Surgical History:  Procedure Laterality Date  . CESAREAN SECTION    . COLONOSCOPY N/A 08/20/2015   Procedure: COLONOSCOPY;  Surgeon: Danie Binder, MD;  Location: [facility];  Service: Endoscopy;  Laterality: N/A;  1045  . KNEE SURGERY     right    Social History   Socioeconomic History  . Marital status: Single    Spouse name: Not on file  . Number of children: Not on file  . Years of education: Not on file  . Highest education level: Not on file  Occupational History  . Occupation: [employer]  Social Needs  .  Financial resource strain: Not on file  . Food insecurity    Worry: Not on file    Inability: Not on file  . Transportation needs    Medical: Not on file    Non-medical: Not on file  Tobacco Use  . Smoking status: Current Every Day Smoker    Packs/day: 0.12    Years: 13.00    Pack years: 1.56    Types: Cigarettes    Start date: 06/30/2000  . Smokeless tobacco: Never Used  Substance and Sexual Activity  . Alcohol use: Yes    Alcohol/week: 0.0 standard drinks    Comment: Drinks at least 3-4 times a week, vodka and cranberry, normally after work   . Drug use: No  . Sexual activity: Not on file  Lifestyle  . Physical activity    Days per week: Not  on file    Minutes per session: Not on file  . Stress: Not on file  Relationships  . Social Herbalist on phone: Not on file    Gets together: Not on file    Attends religious service: Not on file    Active member of club or organization: Not on file    Attends meetings of clubs or organizations: Not on file    Relationship status: Not on file  . Intimate partner violence    Fear of current or ex partner: Not on file    Emotionally abused: Not on file    Physically abused: Not on file    Forced sexual activity: Not on file  Other Topics Concern  . Not on file  Social History Narrative  . Not on file     No family history of premature CAD in 1st degree relatives.  Current Meds  Medication Sig  . diltiazem (CARDIZEM) 30 MG tablet Take 1 tablet (30 mg total) by mouth 2 (two) times daily.  Marland Kitchen esomeprazole (NEXIUM) 40 MG capsule Take 40 mg by mouth daily.   Mar[place] Kitchen ibuprofen (ADVIL,MOTRIN) 800 MG tablet Take 800 mg by mouth every 8 (eight) hours as needed. pain  . lisinopril-hydrochlorothiazide (PRINZIDE,ZESTORETIC) 20-12.5 MG tablet Take 1 tablet by mouth daily.  . [DISCONTINUED] clonazePAM (KLONOPIN) 0.5 MG tablet Take 0.5 mg by mouth 2 (two) times daily as needed. anxiety  . [DISCONTINUED] naproxen (NAPROSYN) 500 MG tablet Take 1 tablet (500 mg total) by mouth 2 (two) times daily with a meal.      Review of systems complete and found to be negative unless listed above in HPI    Physical exam Blood pressure 118/78, pulse (!) 101, height 5\' 4"  (1.626 m), weight 145 lb (65.8 kg), SpO2 98 %. General: NAD Neck: No JVD, no thyromegaly or thyroid nodule.  Lungs: Clear to auscultation bilaterally with normal respiratory effort. CV: Nondisplaced PMI. Regular rate and rhythm, normal S1/S2, no S3/S4, no murmur.  No peripheral edema.  No carotid bruit.    Abdomen: Soft, nontender, no distention.  Skin: Intact without lesions or rashes.  Neurologic: Alert and oriented x 3.   Psych: Normal affect. Extremities: No clubbing or cyanosis.  HEENT: Normal.   ECG: Most recent ECG reviewed.   Labs: Lab Results  Component Value Date/Time   K 3.6 07/24/2015 12:43 PM   BUN 8 07/24/2015 12:43 PM   CREATININE 0.69 07/24/2015 12:43 PM   ALT 24 07/24/2015 12:43 PM   TSH 0.922 07/24/2015 12:43 PM   HGB 13.6 07/24/2015 12:43 PM     Lipids: No results  found for: LDLCALC, LDLDIRECT, CHOL, TRIG, HDL      ASSESSMENT AND PLAN:  1.  Tachycardia/palpitations/PSVT: While she has a history of PSVT, EMR documentation states that the ECG showed sinus tachycardia when evaluated in the ED.  ECG today demonstrates sinus tachycardia, 112 bpm.  She is currently taking diltiazem 30 mg twice daily.  I will try to obtain a copy of the ECG.  In order to advance calcium channel blocker therapy, I will stop lisinopril-hydrochlorothiazide and switch short acting diltiazem to Cardizem CD 120 mg daily. I will order a 2-D echocardiogram with Doppler to evaluate cardiac structure, function, and regional wall motion. I will also obtain a 2-week event monitor.  2.  Hypertension: Blood pressure is normal.  Please refer to discussion in #1.  3.  GERD: Currently on Nexium.  She does describe dysphagia for solids on occasion.    Disposition: Follow up in 3 months  Signed: Kate Sable, M.D., F.A.C.C.  05/11/2019, 10:49 AM

## 2019-05-11 NOTE — Telephone Encounter (Signed)
Pre-cert Verification for the following procedure    Cardiac event monitor  2 WEEK

## 2019-05-11 NOTE — Patient Instructions (Addendum)
Medication Instructions:   Stop Zestoretic (Lisinopril / HCTZ).   Stop short acting Cardizem.  Begin Cardizem CD 120mg  daily.  Continue all other medications.    Labwork: none  Testing/Procedures:  Your physician has requested that you have an echocardiogram. Echocardiography is a painless test that uses sound waves to create images of your heart. It provides your doctor with information about the size and shape of your heart and how well your heart's chambers and valves are working. This procedure takes approximately one hour. There are no restrictions for this procedure.  Your physician has recommended that you wear a 2 week event monitor. Event monitors are medical devices that record the heart's electrical activity. Doctors most often [place] these monitors to diagnose arrhythmias. Arrhythmias are problems with the speed or rhythm of the heartbeat. The monitor is a small, portable device. You can wear one while you do your normal daily activities. This is usually used to diagnose what is causing palpitations/syncope (passing out).  Office will contact with results via phone or letter.    Follow-Up: 3 months   Any Other Special Instructions Will Be Listed Below (If Applicable).  If you need a refill on your cardiac medications before your next appointment, please call your pharmacy.

## 2019-05-11 NOTE — Telephone Encounter (Signed)
PERCERT FOR :  ECHO - PALPITATIONS DR. Bronson Ing  Scheduled for 05/19/2019 at [place]

## 2019-05-18 ENCOUNTER — Telehealth: Payer: Self-pay | Admitting: *Deleted

## 2019-05-18 ENCOUNTER — Ambulatory Visit (INDEPENDENT_AMBULATORY_CARE_PROVIDER_SITE_OTHER): Payer: BC Managed Care – PPO

## 2019-05-18 ENCOUNTER — Other Ambulatory Visit: Payer: Self-pay | Admitting: Cardiovascular Disease

## 2019-05-18 ENCOUNTER — Other Ambulatory Visit: Payer: Self-pay

## 2019-05-18 DIAGNOSIS — I471 Supraventricular tachycardia: Secondary | ICD-10-CM

## 2019-05-18 NOTE — Telephone Encounter (Signed)
Patient in office for Echo today.  Had concern about the change in her medications.  Recently stopped her Zestoretic & changed her short acting Diltiazem to Cardizem CD 120mg  daily.  States that for the last 2-3 days, she is noticing swelling & stiffness in her knees & legs.  Please advise.

## 2019-05-19 NOTE — Telephone Encounter (Signed)
Does she know what her blood pressure has been?

## 2019-05-20 NOTE — Telephone Encounter (Signed)
No answer

## 2019-05-23 ENCOUNTER — Ambulatory Visit (INDEPENDENT_AMBULATORY_CARE_PROVIDER_SITE_OTHER): Payer: BC Managed Care – PPO

## 2019-05-23 DIAGNOSIS — R002 Palpitations: Secondary | ICD-10-CM

## 2019-05-23 NOTE — Telephone Encounter (Signed)
Left message to return call 

## 2019-05-25 ENCOUNTER — Telehealth: Payer: Self-pay | Admitting: *Deleted

## 2019-05-25 NOTE — Telephone Encounter (Signed)
Notes recorded by Laurine Blazer, LPN on [id number] at 1:27 PM EST  Patient notified via detailed voice message. Copy to pmd. Follow up scheduled for 08/12/2019 with Dr. Bronson Ing in [place] office.    ------   Notes recorded by Laurine Blazer, LPN on [id number] at 5:46 PM EST  Left message to return call.   ------   Notes recorded by Herminio Commons, MD on 05/19/2019 at 8:31 AM EST  Strong pumping function.

## 2019-05-31 NOTE — Telephone Encounter (Signed)
No return call to present date.  Follow up already scheduled for 08/12/2019 with Dr. Bronson Ing in [place] office.

## 2019-06-16 ENCOUNTER — Telehealth: Payer: Self-pay | Admitting: *Deleted

## 2019-06-16 NOTE — Telephone Encounter (Signed)
Patient c/o leg swelling - feet up to her knees.  Her Zestoretic was stopped at last OV.  Her short acting Diltiazem was replaced with Cardizem CD 120mg  daily.  Stated her legs were so swollen and tight x 2-3 days that she put herself back on the Zestoretic due to having the fluid medication in it.  Also, stated that he brother let her borrow a pair of his compression socks which helped as well.  Since resuming the Zestoretic, her legs are feeling better & going down.  She questions if she is able to continue this along with the Cardizem CD.

## 2019-06-16 NOTE — Telephone Encounter (Signed)
Left message to return call 

## 2019-06-16 NOTE — Telephone Encounter (Signed)
That would be fine so long as she monitors her blood pressure to make sure it does not get too low.

## 2019-06-16 NOTE — Telephone Encounter (Signed)
Returned your call.

## 2019-06-16 NOTE — Telephone Encounter (Signed)
Laurine Blazer, [place]  [id number] AM EST    Patient notified. Copy to pmd. Follow up scheduled for February 2021 with Dr. Bronson Ing.

## 2019-06-16 NOTE — Telephone Encounter (Signed)
-----   Message from Herminio Commons, MD sent at 06/14/2019 11:52 AM EST -----  Sinus rhythm and sinus tachycardia. Average heart rate 99 bpm. No significant arrhythmias.  Symptoms correlated with both sinus rhythm and sinus tachycardia.

## 2019-06-17 MED ORDER — LISINOPRIL-HYDROCHLOROTHIAZIDE 20-12.5 MG PO TABS: 1.0000 | ORAL_TABLET | Freq: Every day | ORAL | Status: DC

## 2019-06-17 NOTE — Telephone Encounter (Signed)
Patient notified via detailed voice message.

## 2019-07-21 ENCOUNTER — Telehealth: Payer: Self-pay | Admitting: Cardiovascular Disease

## 2019-07-21 MED ORDER — HYDROCHLOROTHIAZIDE 12.5 MG PO CAPS: 12.5000 mg | ORAL_CAPSULE | Freq: Every day | ORAL | 3 refills | Status: DC

## 2019-07-21 NOTE — Telephone Encounter (Signed)
Patient informed and verbalized understanding of plan. 

## 2019-07-21 NOTE — Telephone Encounter (Signed)
That would be fine. Can prescribe HCTZ 12.5 mg daily.

## 2019-07-21 NOTE — Telephone Encounter (Signed)
Reports continuing to have swelling in her legs although she takes lisinopril/hct 20/12.5 mg daily. Requesting to switch lisinopril/hct to plain hctz. Patient doesn't feel like she need lisinopril/hct but feels she need the water pill to help with the swelling in her legs. Reports SOB & dizziness that she's had for awhile that is about the same as usual. Reports normal blood pressure checks but was unable to give result of readings. Denies chest pain. Medications reviewed. Advised that message would be sent to her provider for request of switching lisinopril/hct to plain hctz. Verbalized understanding.

## 2019-07-21 NOTE — Telephone Encounter (Signed)
Patient called requesting to speak with nurse in regards to her medications.

## 2019-08-12 ENCOUNTER — Telehealth (INDEPENDENT_AMBULATORY_CARE_PROVIDER_SITE_OTHER): Payer: BC Managed Care – PPO | Admitting: Cardiovascular Disease

## 2019-08-12 ENCOUNTER — Telehealth: Payer: Self-pay | Admitting: *Deleted

## 2019-08-12 ENCOUNTER — Encounter: Payer: Self-pay | Admitting: Cardiovascular Disease

## 2019-08-12 DIAGNOSIS — I10 Essential (primary) hypertension: Secondary | ICD-10-CM

## 2019-08-12 DIAGNOSIS — I471 Supraventricular tachycardia: Secondary | ICD-10-CM | POA: Diagnosis not present

## 2019-08-12 DIAGNOSIS — K219 Gastro-esophageal reflux disease without esophagitis: Secondary | ICD-10-CM

## 2019-08-12 DIAGNOSIS — R002 Palpitations: Secondary | ICD-10-CM

## 2019-08-12 DIAGNOSIS — M7989 Other specified soft tissue disorders: Secondary | ICD-10-CM

## 2019-08-12 NOTE — Patient Instructions (Signed)
Medication Instructions:  Continue all current medications.  Labwork: none  Testing/Procedures: none  Follow-Up: 6 months   Any Other Special Instructions Will Be Listed Below (If Applicable). You have been referred to:  (EP) electrophysiology for ablation evaluation - Dr. Rayann Heman.   If you need a refill on your cardiac medications before your next appointment, please call your pharmacy.

## 2019-08-12 NOTE — Addendum Note (Signed)
Addended by: Laurine Blazer on: 08/12/2019 02:41 PM   Modules accepted: Orders

## 2019-08-12 NOTE — Telephone Encounter (Signed)
The patient verbally consented for a telehealth phone visit with [facility] and understands that his/her insurance company will be billed for the encounter.  

## 2019-08-12 NOTE — Progress Notes (Signed)
Virtual Visit via Telephone Note   This visit type was conducted due to national recommendations for restrictions regarding the COVID-19 Pandemic (e.g. social distancing) in an effort to limit this patient's exposure and mitigate transmission in our community.  Due to her co-morbid illnesses, this patient is at least at moderate risk for complications without adequate follow up.  This format is felt to be most appropriate for this patient at this time.  The patient did not have access to video technology/had technical difficulties with video requiring transitioning to audio format only (telephone).  All issues noted in this document were discussed and addressed.  No physical exam could be performed with this format.  Please refer to the patient's chart for her  consent to telehealth for [facility].   Date:  08/12/2019   ID:  Rhonda Patel, DOB 1965/07/16, MRN [id number]  Patient Location: Home Provider Location: Office  PCP:  Glenda Chroman, MD  Cardiologist:  Kate Sable, MD  Electrophysiologist:  None   Evaluation Performed:  Follow-Up Visit  Chief Complaint:  SVT  History of Present Illness:    Rhonda Patel is a 54 y.o. female with SVT.  She seldom has palpitations. She has not had any chest tightness.  She has been busy with work.  HCTZ has caused feet swelling unless she's had a very long day.   Social history: She is engaged.  She works at [employer] in the [employer] in [place].   Past Medical History:  Diagnosis Date  . Asthma   . Hypertension   . SVT (supraventricular tachycardia) (HCC)    Past Surgical History:  Procedure Laterality Date  . CESAREAN SECTION    . COLONOSCOPY N/A 08/20/2015   Procedure: COLONOSCOPY;  Surgeon: Danie Binder, MD;  [facility];  Service: Endoscopy;  Laterality: N/A;  1045  . KNEE SURGERY     right     No outpatient medications have been marked as taking for the 08/12/19  encounter (Telemedicine) with Rhonda Commons, MD.     Allergies:   Beta adrenergic blockers and Metoprolol   Social History   Tobacco Use  . Smoking status: Current Every Day Smoker    Packs/day: 0.12    Years: 13.00    Pack years: 1.56    Types: Cigarettes    Start date: 06/30/2000  . Smokeless tobacco: Never Used  Substance Use Topics  . Alcohol use: Yes    Alcohol/week: 0.0 standard drinks    Comment: Drinks at least 3-4 times a week, vodka and cranberry, normally after work   . Drug use: No     Family Hx: The patient's family history includes Heart attack in an other family member. There is no history of Colon cancer, Colon polyps, Celiac disease, Pancreatic cancer, Stomach cancer, Ulcerative colitis, or Crohn's disease.  ROS:   Please see the history of present illness.     All other systems reviewed and are negative.   Prior CV studies:   The following studies were reviewed today:  Event monitor 06/14/2019:   Sinus rhythm and sinus tachycardia. Average heart rate 99 bpm. No significant arrhythmias.  Symptoms correlated with both sinus rhythm and sinus tachycardia.    Echocardiogram 05/18/2019:  1. Left ventricular ejection fraction, by visual estimation, is 70 to  75%. The left ventricle has hyperdynamic function. There is no left  ventricular hypertrophy.  2. Global right ventricle has normal systolic function.The right  ventricular  size is normal. No increase in right ventricular wall  thickness.  3. Left atrial size was normal.  4. Right atrial size was normal.  5. The mitral valve is grossly normal. Trace mitral valve regurgitation.  6. The tricuspid valve is grossly normal. Tricuspid valve regurgitation  is trivial.  7. The aortic valve is tricuspid. Aortic valve regurgitation is not  visualized.  8. The pulmonic valve was grossly normal. Pulmonic valve regurgitation is  trivial.  9. Normal pulmonary artery systolic pressure.  10. The  tricuspid regurgitant velocity is 2.17 m/s, and with an assumed  right atrial pressure of 3 mmHg, the estimated right ventricular systolic  pressure is normal at 21.8 mmHg.  11. The inferior vena cava is normal in size with greater than 50%  respiratory variability, suggesting right atrial pressure of 3 mmHg.   Labs/Other Tests and Data Reviewed:    EKG:  No ECG reviewed.  Recent Labs: No results found for requested labs within last 8760 hours.   Recent Lipid Panel No results found for: CHOL, TRIG, HDL, CHOLHDL, LDLCALC, LDLDIRECT  Wt Readings from Last 3 Encounters:  05/11/19 145 lb (65.8 kg)  09/07/16 150 lb (68 kg)  08/20/15 163 lb (73.9 kg)     Objective:    Vital Signs:  There were no vitals taken for this visit.   VITAL SIGNS:  reviewed  ASSESSMENT & PLAN:    1.  Tachycardia/palpitations/PSVT: Symptomatically stable on Cardizem CD 120 mg daily. She is interested in radiofrequency ablation. I will make a referral to EP.  2. Hypertension: Currently on HCTZ 12.5 mg daily.  3. GERD: Currently on esomeprazole 40 mg daily.  4. Feet swelling: Has resolved with HCTZ.    COVID-19 Education: The signs and symptoms of COVID-19 were discussed with the patient and how to seek care for testing (follow up with PCP or arrange E-visit).  The importance of social distancing was discussed today.  Time:   Today, I have spent 15 minutes with the patient with telehealth technology discussing the above problems.     Medication Adjustments/Labs and Tests Ordered: Current medicines are reviewed at length with the patient today.  Concerns regarding medicines are outlined above.   Tests Ordered: No orders of the defined types were placed in this encounter.   Medication Changes: No orders of the defined types were placed in this encounter.   Follow Up:  Virtual visit in 6 months  Signed, Kate Sable, MD  08/12/2019 2:31 PM    [place]

## 2019-10-11 ENCOUNTER — Telehealth: Payer: Self-pay

## 2019-10-11 MED ORDER — GENERIC EXTERNAL MEDICATION
Status: DC
Start: ? — End: 2019-10-11

## 2019-10-11 MED ORDER — SODIUM CHLORIDE 0.9 % IV SOLN
10.00 | INTRAVENOUS | Status: DC
Start: ? — End: 2019-10-11

## 2019-10-11 NOTE — Telephone Encounter (Signed)
Left a message regarding appt on 10/12/19.

## 2019-10-12 ENCOUNTER — Other Ambulatory Visit: Payer: Self-pay

## 2019-10-12 ENCOUNTER — Telehealth (INDEPENDENT_AMBULATORY_CARE_PROVIDER_SITE_OTHER): Payer: BC Managed Care – PPO | Admitting: Internal Medicine

## 2019-10-12 DIAGNOSIS — I471 Supraventricular tachycardia: Secondary | ICD-10-CM

## 2019-10-12 MED ORDER — GENERIC EXTERNAL MEDICATION
Status: DC
Start: ? — End: 2019-10-12

## 2019-10-12 NOTE — Progress Notes (Signed)
No show for virtual visit.  Left VM Will need to reschedule visit

## 2019-10-17 ENCOUNTER — Ambulatory Visit: Payer: Self-pay

## 2019-10-19 ENCOUNTER — Encounter: Payer: Self-pay | Admitting: Internal Medicine

## 2019-10-19 ENCOUNTER — Other Ambulatory Visit: Payer: Self-pay

## 2019-10-19 ENCOUNTER — Telehealth (INDEPENDENT_AMBULATORY_CARE_PROVIDER_SITE_OTHER): Payer: BC Managed Care – PPO | Admitting: Internal Medicine

## 2019-10-19 ENCOUNTER — Telehealth: Payer: Self-pay

## 2019-10-19 DIAGNOSIS — I10 Essential (primary) hypertension: Secondary | ICD-10-CM

## 2019-10-19 DIAGNOSIS — I471 Supraventricular tachycardia, unspecified: Secondary | ICD-10-CM

## 2019-10-19 NOTE — Progress Notes (Signed)
Electrophysiology TeleHealth Note   Due to national recommendations of social distancing due to [place] 19, Audio telehealth visit is felt to be most appropriate for this patient at this time.  See MyChart message from today for patient consent regarding telehealth for [facility].   Date:  10/19/2019   ID:  Rhonda Patel, DOB June 13, 1966, MRN [id number]  Location: home Provider location: [address] [place] Evaluation Performed: New patient consult  PCP:  Glenda Chroman, MD  Cardiologist:  Kate Sable, MD  Electrophysiologist:  None   Chief Complaint: SVT  History of Present Illness:    Rhonda Patel is a 54 y.o. female who presents via audio/video conferencing for a telehealth visit today.   The patient is referred for new consultation regarding SVT by Dr Bronson Ing.   She reports abrupt onset/ offset in tachypalpitations.  Her first episode occurred in 2008.  She was given adenosine with termination at that time.  She has had increasing episodes since that time.  She reports having about 10 episodes per year but has gone to the ER about 3 times per year over the past few years.  She finds that occasional coughing, sneezing, or lifting her arms over her head have caused events. She has received adenosine multiple times.  She has been taking diltiazem without relief.  Vagal maneuvers have not been helpful.  Most recently, she presented to the ED in [facility] and was given adenosine with termination. Today, she denies symptoms of chest pain, shortness of breath, orthopnea, PND, lower extremity edema, claudication, dizziness, presyncope, syncope, bleeding, or neurologic sequela. The patient is tolerating medications without difficulties and is otherwise without complaint today.     Past Medical History:  Diagnosis Date  . Asthma   . Hypertension   . SVT (supraventricular tachycardia) (HCC)     Past Surgical History:  Procedure Laterality Date  .  CESAREAN SECTION    . COLONOSCOPY N/A 08/20/2015   Procedure: COLONOSCOPY;  Surgeon: Danie Binder, MD;  [facility];  Service: Endoscopy;  Laterality: N/A;  1045  . KNEE SURGERY     right    Current Outpatient Medications  Medication Sig Dispense Refill  . diltiazem (CARDIZEM CD) 120 MG 24 hr capsule Take 1 capsule (120 mg total) by mouth daily. 30 capsule 6  . esomeprazole (NEXIUM) 40 MG capsule Take 40 mg by mouth daily.     . hydrochlorothiazide (MICROZIDE) 12.5 MG capsule Take 1 capsule (12.5 mg total) by mouth daily. 90 capsule 3  . ibuprofen (ADVIL,MOTRIN) 800 MG tablet Take 800 mg by mouth every 8 (eight) hours as needed. pain     No current facility-administered medications for this visit.    Allergies:   Beta adrenergic blockers and Metoprolol   Social History:  The patient  reports that she has been smoking cigarettes. She started smoking about 19 years ago. She has a 1.56 pack-year smoking history. She has never used smokeless tobacco. She reports current alcohol use. She reports that she does not use drugs.   Family History:  The patient's family history includes Heart attack in an other family member.    ROS:  Please see the history of present illness.   All other systems are personally reviewed and negative.    Exam:    Vital Signs:  There were no vitals taken for this visit.  Well sounding, alert and conversant    Labs/Other Tests and Data Reviewed:  Recent Labs: No results found for requested labs within last 8760 hours.   Wt Readings from Last 3 Encounters:  05/11/19 145 lb (65.8 kg)  09/07/16 150 lb (68 kg)  08/20/15 163 lb (73.9 kg)     Other studies personally reviewed: Additional studies/ records that were reviewed today include: Dr Raylene Everts notes, prior echo, ekgs, recent ER notes  Review of the above records today demonstrates: as above  ekg from 05/13/12 reveals short RP SVT with pseudo r' in V1 and pseudo S in inferior  leads  ASSESSMENT & PLAN:    1.  SVT She has documented adenosine sensitive short RP SVT.  She has failed medical therapy with diltiazem.  Therapeutic strategies for supraventricular tachycardia including medicine and ablation were discussed in detail with the patient today. Risk, benefits, and alternatives to EP study and radiofrequency ablation were also discussed in detail today. These risks include but are not limited to stroke, bleeding, vascular damage, tamponade, perforation, damage to the heart and other structures, AV block requiring pacemaker, worsening renal function, and death. The patient understands these risk and wishes to proceed.  We will therefore proceed with catheter ablation at the next available time. Carto/ ICE/ anesthesia are requested for the procedure.  2. HTN Stable No change required today   Patient Risk:  after full review of this patients clinical status, I feel that they are at moderate risk at this time.   Today, I have spent 20 minutes with the patient with telehealth technology discussing SVT .    Signed, Thompson Grayer MD, [place] 10/19/2019 3:32 PM   [facility] [address] [place] [address] [phone] (office) [phone] (fax)

## 2019-10-19 NOTE — Telephone Encounter (Signed)
-----   Message from Thompson Grayer, MD sent at 10/19/2019  3:38 PM EDT ----- SVT ablation C/I/A

## 2019-10-19 NOTE — Telephone Encounter (Signed)
Call placed to Pt.  Pt would like to schedule SVT ablation for Nov 22, 2019 at 10:30 am  Labs/covid test scheduled  Instruction letter sent via mychart  Work up complete

## 2019-11-18 ENCOUNTER — Other Ambulatory Visit: Payer: Self-pay

## 2019-11-18 ENCOUNTER — Other Ambulatory Visit (HOSPITAL_COMMUNITY)
Admission: RE | Admit: 2019-11-18 | Discharge: 2019-11-18 | Disposition: A | Discharge status: Home / Self Care | Payer: BC Managed Care – PPO | Source: Ambulatory Visit | Attending: Internal Medicine | Admitting: Internal Medicine

## 2019-11-18 ENCOUNTER — Other Ambulatory Visit: Payer: BC Managed Care – PPO | Admitting: *Deleted

## 2019-11-18 DIAGNOSIS — Z01812 Encounter for preprocedural laboratory examination: Secondary | ICD-10-CM | POA: Insufficient documentation

## 2019-11-18 DIAGNOSIS — I471 Supraventricular tachycardia: Secondary | ICD-10-CM

## 2019-11-18 DIAGNOSIS — Z20822 Contact with and (suspected) exposure to covid-19: Secondary | ICD-10-CM | POA: Insufficient documentation

## 2019-11-19 LAB — CBC WITH DIFFERENTIAL/PLATELET
Basophils Absolute: 0.1 10*3/uL (ref 0.0–0.2)
Basos: 1 %
EOS (ABSOLUTE): 0.2 10*3/uL (ref 0.0–0.4)
Eos: 3 %
Hematocrit: 37.4 % (ref 34.0–46.6)
Hemoglobin: 12.9 g/dL (ref 11.1–15.9)
Immature Grans (Abs): 0 10*3/uL (ref 0.0–0.1)
Immature Granulocytes: 0 %
Lymphocytes Absolute: 3.6 10*3/uL — ABNORMAL HIGH (ref 0.7–3.1)
Lymphs: 47 %
MCH: 31.8 pg (ref 26.6–33.0)
MCHC: 34.5 g/dL (ref 31.5–35.7)
MCV: 92 fL (ref 79–97)
Monocytes Absolute: 0.5 10*3/uL (ref 0.1–0.9)
Monocytes: 7 %
Neutrophils Absolute: 3.2 10*3/uL (ref 1.4–7.0)
Neutrophils: 42 %
Platelets: 470 10*3/uL — ABNORMAL HIGH (ref 150–450)
RBC: 4.06 x10E6/uL (ref 3.77–5.28)
RDW: 12.5 % (ref 11.7–15.4)
WBC: 7.6 10*3/uL (ref 3.4–10.8)

## 2019-11-19 LAB — BASIC METABOLIC PANEL
BUN/Creatinine Ratio: 18 (ref 9–23)
BUN: 13 mg/dL (ref 6–24)
CO2: 26 mmol/L (ref 20–29)
Calcium: 9.6 mg/dL (ref 8.7–10.2)
Chloride: 99 mmol/L (ref 96–106)
Creatinine, Ser: 0.73 mg/dL (ref 0.57–1.00)
GFR calc Af Amer: 110 mL/min/{1.73_m2} (ref >59)
GFR calc non Af Amer: 96 mL/min/{1.73_m2} (ref >59)
Glucose: 95 mg/dL (ref 65–99)
Potassium: 4.3 mmol/L (ref 3.5–5.2)
Sodium: 142 mmol/L (ref 134–144)

## 2019-11-19 LAB — SARS CORONAVIRUS 2 (TAT 6-24 HRS): SARS Coronavirus 2: NEGATIVE

## 2019-11-22 ENCOUNTER — Encounter (HOSPITAL_COMMUNITY)
Admission: RE | Disposition: A | Discharge status: Home / Self Care | Payer: Self-pay | Source: Home / Self Care | Attending: Internal Medicine

## 2019-11-22 ENCOUNTER — Ambulatory Visit (HOSPITAL_COMMUNITY): Payer: BC Managed Care – PPO | Admitting: Anesthesiology

## 2019-11-22 ENCOUNTER — Encounter (HOSPITAL_COMMUNITY): Payer: Self-pay | Admitting: Internal Medicine

## 2019-11-22 ENCOUNTER — Ambulatory Visit (HOSPITAL_COMMUNITY)
Admission: RE | Admit: 2019-11-22 | Discharge: 2019-11-22 | Disposition: A | Discharge status: Home / Self Care | Payer: BC Managed Care – PPO | Attending: Internal Medicine | Admitting: Internal Medicine

## 2019-11-22 ENCOUNTER — Other Ambulatory Visit: Payer: Self-pay

## 2019-11-22 DIAGNOSIS — I10 Essential (primary) hypertension: Secondary | ICD-10-CM | POA: Diagnosis not present

## 2019-11-22 DIAGNOSIS — J45909 Unspecified asthma, uncomplicated: Secondary | ICD-10-CM | POA: Diagnosis not present

## 2019-11-22 DIAGNOSIS — Z79899 Other long term (current) drug therapy: Secondary | ICD-10-CM | POA: Insufficient documentation

## 2019-11-22 DIAGNOSIS — I471 Supraventricular tachycardia: Secondary | ICD-10-CM | POA: Diagnosis present

## 2019-11-22 DIAGNOSIS — F17210 Nicotine dependence, cigarettes, uncomplicated: Secondary | ICD-10-CM | POA: Diagnosis not present

## 2019-11-22 HISTORY — PX: SVT ABLATION: EP1225

## 2019-11-22 LAB — PREGNANCY, URINE: Preg Test, Ur: NEGATIVE

## 2019-11-22 SURGERY — SVT ABLATION
Anesthesia: Monitor Anesthesia Care

## 2019-11-22 MED ORDER — PROPOFOL 500 MG/50ML IV EMUL
INTRAVENOUS | Status: DC | PRN
Start: 2019-11-22 — End: 2019-11-22
  Administered 2019-11-22: 125 ug/kg/min via INTRAVENOUS

## 2019-11-22 MED ORDER — SODIUM CHLORIDE 0.9% FLUSH: 3.0000 mL | INTRAVENOUS | Status: DC | PRN

## 2019-11-22 MED ORDER — HEPARIN (PORCINE) IN NACL 2000-0.9 UNIT/L-% IV SOLN: INTRAVENOUS | Status: DC | PRN

## 2019-11-22 MED ORDER — SODIUM CHLORIDE 0.9% FLUSH: 3.0000 mL | Freq: Two times a day (BID) | INTRAVENOUS | Status: DC

## 2019-11-22 MED ORDER — BUPIVACAINE HCL (PF) 0.25 % IJ SOLN
INTRAMUSCULAR | Status: DC | PRN
  Administered 2019-11-22: 60 mL

## 2019-11-22 MED ORDER — SODIUM CHLORIDE 0.9 % IV SOLN: 250.0000 mL | INTRAVENOUS | Status: DC | PRN

## 2019-11-22 MED ORDER — LIDOCAINE 2% (20 MG/ML) 5 ML SYRINGE
INTRAMUSCULAR | Status: DC | PRN
  Administered 2019-11-22: 60 mg via INTRAVENOUS

## 2019-11-22 MED ORDER — ACETAMINOPHEN 325 MG PO TABS: 650.0000 mg | ORAL_TABLET | ORAL | Status: DC | PRN

## 2019-11-22 MED ORDER — HYDROCHLOROTHIAZIDE 12.5 MG PO CAPS
12.5000 mg | ORAL_CAPSULE | Freq: Once | ORAL | Status: AC
  Administered 2019-11-22: 12.5 mg via ORAL
  Filled 2019-11-22: qty 1

## 2019-11-22 MED ORDER — HYDROCODONE-ACETAMINOPHEN 5-325 MG PO TABS
1.0000 | ORAL_TABLET | ORAL | Status: DC | PRN
Start: 2019-11-22 — End: 2019-11-22

## 2019-11-22 MED ORDER — FENTANYL CITRATE (PF) 100 MCG/2ML IJ SOLN
INTRAMUSCULAR | Status: DC | PRN
  Administered 2019-11-22: 50 ug via INTRAVENOUS
  Administered 2019-11-22 (×2): 25 ug via INTRAVENOUS

## 2019-11-22 MED ORDER — ISOPROTERENOL HCL 0.2 MG/ML IJ SOLN
INTRAVENOUS | Status: DC | PRN
  Administered 2019-11-22: 4 ug/min via INTRAVENOUS

## 2019-11-22 MED ORDER — SODIUM CHLORIDE 0.9 % IV SOLN: INTRAVENOUS | Status: DC

## 2019-11-22 MED ORDER — ISOPROTERENOL HCL 0.2 MG/ML IJ SOLN
INTRAMUSCULAR | Status: AC
  Filled 2019-11-22: qty 5

## 2019-11-22 MED ORDER — ONDANSETRON HCL 4 MG/2ML IJ SOLN: 4.0000 mg | Freq: Four times a day (QID) | INTRAMUSCULAR | Status: DC | PRN

## 2019-11-22 MED ORDER — PROPOFOL 10 MG/ML IV BOLUS
INTRAVENOUS | Status: DC | PRN
Start: 2019-11-22 — End: 2019-11-22
  Administered 2019-11-22: 30 mg via INTRAVENOUS
  Administered 2019-11-22: 20 mg via INTRAVENOUS
  Administered 2019-11-22: 30 mg via INTRAVENOUS
  Administered 2019-11-22 (×3): 20 mg via INTRAVENOUS

## 2019-11-22 MED ORDER — BUPIVACAINE HCL (PF) 0.25 % IJ SOLN
INTRAMUSCULAR | Status: AC
  Filled 2019-11-22: qty 30

## 2019-11-22 MED ORDER — PHENYLEPHRINE HCL (PRESSORS) 10 MG/ML IV SOLN
INTRAVENOUS | Status: DC | PRN
Start: 2019-11-22 — End: 2019-11-22
  Administered 2019-11-22: 120 ug via INTRAVENOUS

## 2019-11-22 MED ORDER — DILTIAZEM HCL ER COATED BEADS 120 MG PO CP24
120.0000 mg | ORAL_CAPSULE | Freq: Once | ORAL | Status: AC
  Administered 2019-11-22: 120 mg via ORAL
  Filled 2019-11-22: qty 1

## 2019-11-22 MED ORDER — MIDAZOLAM HCL 5 MG/5ML IJ SOLN
INTRAMUSCULAR | Status: DC | PRN
  Administered 2019-11-22: 1 mg via INTRAVENOUS
  Administered 2019-11-22: 2 mg via INTRAVENOUS

## 2019-11-22 MED ORDER — ONDANSETRON HCL 4 MG/2ML IJ SOLN
INTRAMUSCULAR | Status: DC | PRN
  Administered 2019-11-22: 4 mg via INTRAVENOUS

## 2019-11-22 SURGICAL SUPPLY — 15 items
BLANKET WARM UNDERBOD FULL ACC (MISCELLANEOUS) ×2 IMPLANT
CATH EZ STEER NAV 4MM D-F CUR (ABLATOR) ×2 IMPLANT
CATH JOSEPH QUAD ALLRED 6F REP (CATHETERS) ×4 IMPLANT
CATH WEBSTER BI DIR CS D-F CRV (CATHETERS) ×2 IMPLANT
DEVICE CLOSURE PERCLS PRGLD 6F (VASCULAR PRODUCTS) IMPLANT
PACK EP LATEX FREE (CUSTOM PROCEDURE TRAY) ×2
PACK EP LF (CUSTOM PROCEDURE TRAY) ×1 IMPLANT
PAD PRO RADIOLUCENT 2001M-C (PAD) ×3 IMPLANT
PATCH CARTO3 (PAD) ×2 IMPLANT
PERCLOSE PROGLIDE 6F (VASCULAR PRODUCTS) ×9
SHEATH PINNACLE 6F 10CM (SHEATH) ×2 IMPLANT
SHEATH PINNACLE 7F 10CM (SHEATH) ×2 IMPLANT
SHEATH PINNACLE 8F 10CM (SHEATH) ×2 IMPLANT
SHEATH PROBE COVER 6X72 (BAG) ×2 IMPLANT
SHIELD RADPAD SCOOP 12X17 (MISCELLANEOUS) ×3 IMPLANT

## 2019-11-22 NOTE — Anesthesia Postprocedure Evaluation (Signed)
Anesthesia Post Note  Patient: Rhonda Patel  Procedure(s) Performed: SVT ABLATION (N/A )     Patient location during evaluation: Cath Lab Anesthesia Type: MAC Level of consciousness: awake and alert, oriented and patient cooperative Pain management: pain level controlled Vital Signs Assessment: post-procedure vital signs reviewed and stable Respiratory status: spontaneous breathing, nonlabored ventilation, respiratory function stable and patient connected to nasal cannula oxygen Cardiovascular status: blood pressure returned to baseline and stable Postop Assessment: no apparent nausea or vomiting Anesthetic complications: no    Last Vitals:  Vitals:   11/22/19 1330 11/22/19 1350  BP: (!) 171/90 (!) 143/73  Pulse: 81 82  Resp: 19 16  Temp:    SpO2: 100% 98%    Last Pain:  Vitals:   11/22/19 1350  TempSrc:   PainSc: 0-No pain                 Dariyon Urquilla,E. Aviyana Sonntag

## 2019-11-22 NOTE — Discharge Instructions (Signed)
Cardiac Ablation, Care After This sheet gives you information about how to care for yourself after your procedure. Your health care provider may also give you more specific instructions. If you have problems or questions, contact your health care provider. What can I expect after the procedure? After the procedure, it is common to have:  Bruising around your puncture site.  Tenderness around your puncture site.  Skipped heartbeats.  Tiredness (fatigue). Follow these instructions at home: Puncture site care   Follow instructions from your health care provider about how to take care of your puncture site. Make sure you: ? Wash your hands with soap and water before you change your bandage (dressing). If soap and water are not available, use hand sanitizer. ? Change your dressing as told by your health care provider. ? Leave stitches (sutures), skin glue, or adhesive strips in place. These skin closures may need to stay in place for up to 2 weeks. If adhesive strip edges start to loosen and curl up, you may trim the loose edges. Do not remove adhesive strips completely unless your health care provider tells you to do that.  Check your puncture site every day for signs of infection. Check for: ? Redness, swelling, or pain. ? Fluid or blood. If your puncture site starts to bleed, lie down on your back, apply firm pressure to the area, and contact your health care provider. ? Warmth. ? Pus or a bad smell. Driving  Ask your health care provider when it is safe for you to drive again after the procedure.  Do not drive or use heavy machinery while taking prescription pain medicine.  Do not drive for 24 hours if you were given a medicine to help you relax (sedative) during your procedure. Activity  Avoid activities that take a lot of effort for at least 3 days after your procedure.  Do not lift anything that is heavier than 10 lb (4.5 kg), or the limit that you are told, until your health  care provider says that it is safe.  Return to your normal activities as told by your health care provider. Ask your health care provider what activities are safe for you. General instructions  Take over-the-counter and prescription medicines only as told by your health care provider.  Do not use any products that contain nicotine or tobacco, such as cigarettes and e-cigarettes. If you need help quitting, ask your health care provider.  Do not take baths, swim, or use a hot tub until your health care provider approves.  Do not drink alcohol for 24 hours after your procedure.  Keep all follow-up visits as told by your health care provider. This is important. Contact a health care provider if:  You have redness, mild swelling, or pain around your puncture site.  You have fluid or blood coming from your puncture site that stops after applying firm pressure to the area.  Your puncture site feels warm to the touch.  You have pus or a bad smell coming from your puncture site.  You have a fever.  You have chest pain or discomfort that spreads to your neck, jaw, or arm.  You are sweating a lot.  You feel nauseous.  You have a fast or irregular heartbeat.  You have shortness of breath.  You are dizzy or light-headed and feel the need to lie down.  You have pain or numbness in the arm or leg closest to your puncture site. Get help right away if:  Your puncture   site suddenly swells.  Your puncture site is bleeding and the bleeding does not stop after applying firm pressure to the area. These symptoms may represent a serious problem that is an emergency. Do not wait to see if the symptoms will go away. Get medical help right away. Call your local emergency services (911 in the [place] Do not drive yourself to the hospital. Summary  After the procedure, it is normal to have bruising and tenderness at the puncture site in your groin, neck, or forearm.  Check your puncture site every  day for signs of infection.  Get help right away if your puncture site is bleeding and the bleeding does not stop after applying firm pressure to the area. This is a medical emergency. This information is not intended to replace advice given to you by your health care provider. Make sure you discuss any questions you have with your health care provider. Document Revised: 05/29/2017 Document Reviewed: 09/25/2016 Elsevier Patient Education  2020 Elsevier Inc.  

## 2019-11-22 NOTE — Transfer of Care (Signed)
Immediate Anesthesia Transfer of Care Note  Patient: Rhonda Patel  Procedure(s) Performed: SVT ABLATION (N/A )  Patient Location: Cath Lab  Anesthesia Type:MAC  Level of Consciousness: awake, alert , oriented and patient cooperative  Airway & Oxygen Therapy: Patient Spontanous Breathing and Patient connected to nasal cannula oxygen  Post-op Assessment: Report given to RN and Post -op Vital signs reviewed and stable  Post vital signs: Reviewed and stable  Last Vitals:  Vitals Value Taken Time  BP 147/79 11/22/19 1237  Temp    Pulse 74 11/22/19 1238  Resp 16 11/22/19 1238  SpO2 99 % 11/22/19 1238  Vitals shown include unvalidated device data.  Last Pain:  Vitals:   11/22/19 0945  TempSrc:   PainSc: 0-No pain         Complications: No apparent anesthesia complications

## 2019-11-22 NOTE — Anesthesia Preprocedure Evaluation (Signed)
Anesthesia Evaluation  Patient identified by MRN, date of birth, ID band Patient awake    Reviewed: Allergy & Precautions, NPO status , Patient's Chart, lab work & pertinent test results  History of Anesthesia Complications Negative for: history of anesthetic complications  Airway Mallampati: II  TM Distance: >3 FB Neck ROM: Full  Mouth opening: Limited Mouth Opening  Dental  (+) Dental Advisory Given, Chipped, Missing   Pulmonary Current SmokerPatient did not abstain from smoking.,  11/18/2019 SARS coronavirus NEG   breath sounds clear to auscultation       Cardiovascular hypertension, Pt. on medications (-) angina+ dysrhythmias Supra Ventricular Tachycardia  Rhythm:Regular Rate:Normal     Neuro/Psych negative neurological ROS     GI/Hepatic Neg liver ROS, GERD  Controlled,  Endo/Other  negative endocrine ROS  Renal/GU negative Renal ROS     Musculoskeletal negative musculoskeletal ROS (+)   Abdominal   Peds  Hematology negative hematology ROS (+)   Anesthesia Other Findings   Reproductive/Obstetrics LMP 2-3 weeks ago                             Anesthesia Physical Anesthesia Plan  ASA: III  Anesthesia Plan: MAC   Post-op Pain Management:    Induction:   PONV Risk Score and Plan: 1 and Ondansetron  Airway Management Planned: Natural Airway and Simple Face Mask  Additional Equipment:   Intra-op Plan:   Post-operative Plan:   Informed Consent: I have reviewed the patients History and Physical, chart, labs and discussed the procedure including the risks, benefits and alternatives for the proposed anesthesia with the patient or authorized representative who has indicated his/her understanding and acceptance.     Dental advisory given  Plan Discussed with: CRNA and Surgeon  Anesthesia Plan Comments:         Anesthesia Quick Evaluation

## 2019-11-22 NOTE — H&P (Signed)
Chief Complaint: SVT  History of Present Illness:    Rhonda Patel is a 54 y.o. female who presents for SVT ablation.   She reports abrupt onset/ offset in tachypalpitations.  Her first episode occurred in 2008.  She was given adenosine with termination at that time.  She has had increasing episodes since that time.  She reports having about 10 episodes per year but has gone to the ER about 3 times per year over the past few years.  She finds that occasional coughing, sneezing, or lifting her arms over her head have caused events. She has received adenosine multiple times.  She has been taking diltiazem without relief.  Vagal maneuvers have not been helpful.  Most recently, she presented to the ED in [facility] and was given adenosine with termination. Today, she denies symptoms of chest pain, shortness of breath, orthopnea, PND, lower extremity edema, claudication, dizziness, presyncope, syncope, bleeding, or neurologic sequela. The patient is tolerating medications without difficulties and is otherwise without complaint today.         Past Medical History:  Diagnosis Date  . Asthma   . Hypertension   . SVT (supraventricular tachycardia) (HCC)          Past Surgical History:  Procedure Laterality Date  . CESAREAN SECTION    . COLONOSCOPY N/A 08/20/2015   Procedure: COLONOSCOPY;  Surgeon: Danie Binder, MD;  [facility];  Service: Endoscopy;  Laterality: N/A;  1045  . KNEE SURGERY     right          Current Outpatient Medications  Medication Sig Dispense Refill  . diltiazem (CARDIZEM CD) 120 MG 24 hr capsule Take 1 capsule (120 mg total) by mouth daily. 30 capsule 6  . esomeprazole (NEXIUM) 40 MG capsule Take 40 mg by mouth daily.     . hydrochlorothiazide (MICROZIDE) 12.5 MG capsule Take 1 capsule (12.5 mg total) by mouth daily. 90 capsule 3  . ibuprofen (ADVIL,MOTRIN) 800 MG tablet Take 800 mg by mouth every 8 (eight) hours as needed. pain      No current facility-administered medications for this visit.    Allergies:   Beta adrenergic blockers and Metoprolol   Social History:  The patient  reports that she has been smoking cigarettes. She started smoking about 19 years ago. She has a 1.56 pack-year smoking history. She has never used smokeless tobacco. She reports current alcohol use. She reports that she does not use drugs.   Family History:  The patient's family history includes Heart attack in an other family member.    ROS:  Please see the history of present illness.   All other systems are personally reviewed and negative.   Physical Exam: Vitals:   11/22/19 0915  BP: (!) 155/90  Pulse: 74  Resp: 16  Temp: (!) 97.5 F (36.4 C)  TempSrc: Skin  SpO2: 100%  Weight: 65.8 kg  Height: 5\' 4"  (1.626 m)    GEN- The patient is well appearing, alert and oriented x 3 today.   Head- normocephalic, atraumatic Eyes-  Sclera clear, conjunctiva pink Ears- hearing intact Oropharynx- clear Neck- supple, Lungs- Clear to ausculation bilaterally, normal work of breathing Heart- Regular rate and rhythm, no murmurs, rubs or gallops, PMI not laterally displaced GI- soft, NT, ND, + BS Extremities- no clubbing, cyanosis, or edema, groin is without hematoma/ bruit MS- no significant deformity or atrophy Skin- no rash or lesion Psych- euthymic mood, full affect Neuro- strength and sensation are  intact    Labs/Other Tests and Data Reviewed:    Recent Labs: No results found for requested labs within last 8760 hours.      Wt Readings from Last 3 Encounters:  05/11/19 145 lb (65.8 kg)  09/07/16 150 lb (68 kg)  08/20/15 163 lb (73.9 kg)     Other studies personally reviewed: Additional studies/ records that were reviewed today include: Dr Raylene Everts notes, prior echo, ekgs, recent ER notes  Review of the above records today demonstrates: as above  ekg from 05/13/12 reveals short RP SVT with pseudo r' in V1  and pseudo S in inferior leads  ASSESSMENT & PLAN:    1.  SVT She has documented adenosine sensitive short RP SVT.  She has failed medical therapy with diltiazem.  Risk, benefits, and alternatives to EP study and radiofrequency ablation were also discussed in detail today. These risks include but are not limited to stroke, bleeding, vascular damage, tamponade, perforation, damage to the heart and other structures, AV block requiring pacemaker, worsening renal function, and death. The patient understands these risk and wishes to proceed.    Thompson Grayer MD, [place] 11/22/2019 10:37 AM

## 2019-12-28 ENCOUNTER — Ambulatory Visit: Payer: BC Managed Care – PPO | Admitting: Internal Medicine

## 2020-02-08 ENCOUNTER — Telehealth: Payer: BC Managed Care – PPO | Admitting: Cardiovascular Disease

## 2020-02-16 ENCOUNTER — Ambulatory Visit: Payer: BC Managed Care – PPO | Admitting: Internal Medicine

## 2022-10-28 ENCOUNTER — Telehealth (INDEPENDENT_AMBULATORY_CARE_PROVIDER_SITE_OTHER): Payer: Self-pay | Admitting: *Deleted

## 2022-10-28 ENCOUNTER — Encounter: Payer: Self-pay | Admitting: Gastroenterology

## 2022-10-28 ENCOUNTER — Ambulatory Visit (INDEPENDENT_AMBULATORY_CARE_PROVIDER_SITE_OTHER): Payer: Medicaid Other | Admitting: Gastroenterology

## 2022-10-28 VITALS — BP 109/73 | HR 109 | Temp 97.5°F | Ht 64.75 in | Wt 190.5 lb

## 2022-10-28 DIAGNOSIS — K219 Gastro-esophageal reflux disease without esophagitis: Secondary | ICD-10-CM

## 2022-10-28 DIAGNOSIS — R1013 Epigastric pain: Secondary | ICD-10-CM | POA: Diagnosis not present

## 2022-10-28 DIAGNOSIS — K582 Mixed irritable bowel syndrome: Secondary | ICD-10-CM | POA: Diagnosis not present

## 2022-10-28 DIAGNOSIS — R140 Abdominal distension (gaseous): Secondary | ICD-10-CM

## 2022-10-28 DIAGNOSIS — R630 Anorexia: Secondary | ICD-10-CM

## 2022-10-28 NOTE — Telephone Encounter (Signed)
patient left message checking status of referral, I see one in her chart, can you please give her a call and schedule her an OV [phone])

## 2022-10-28 NOTE — Progress Notes (Signed)
GI Office Note    Referring Provider: Ignatius Specking, MD Primary Care Physician:  Ignatius Specking, MD  Primary Gastroenterologist: Hennie Duos. Marletta Lor, DO (previously Dr. Darrick Penna)  Chief Complaint   Chief Complaint  Patient presents with   [facility]    Patient here today due to bloating and midsternum pain. Patient has history of IBS mix.Patient says gerd is controlled by pantoprazole 40 mg once per day this is controlling her gerd symptoms per patient.   History of Present Illness   Rhonda Patel is a 57 y.o. female presenting today at the request of Vyas, Dhruv B, MD for GERD and epigastric pressure/bloating.  Colonoscopy 08/20/15: -3 colon polyps -Mild ascending diverticulosis and cecal diverticulosis -Moderate internal hemorrhoids -Pathology revealed inflamed polyp and hyperplastic polyp -Recommended repeat in 10 years  Patient reports GERD is controlled with pantoprazole 40 mg once daily.  Reports she is having bloating and midsternal pain. Was on a bland diet for 2 weeks to allow her to observe how different foods affect her. When she forces herself to eat she would have pain even with small amount of food. She had pain on palpation to the epigastric region. Has an okay appetite. No weight loss.   While supposed to be on bland diet she had chips and salsa. States the grilled meats were okay and the rice was okay but did eat some refried beans. She vomited about less than 2 hours after this meal.   Denies any random nausea or vomiting. When she was having some reflux she as having some of that. Since being on the pantoprazole she has not really had this occur. Complains of severe bloating currently.   States she has been out of her probiotic. Sometimes will take acidophilus. Has also used Cultuerlle in the past as well. Helps if she takes it daily. Usually if she is stressed or eats trigger foods she can have a flare.   Has gone from being constipated to having a loose stool but  within the last 4 days she has noticed a IBS with diarrhea flare. 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   At least 15 cigarettes a day. Sometimes a pack per day. Working on cutting back on drinking, it is sporadic. Sometimes may be moderate use and some days more. Usually about 5 liquor drinks per week. No other drug use. No vaping.   Denies melena or brbpr.   Sates weight has been slowly increasing since last year. Reports her stomach looks abnormal to her.   No family history of celiac disease.    Current Outpatient Medications  Medication Sig Dispense Refill   cetirizine (ZYRTEC) 10 MG tablet Take 10 mg by mouth daily as needed for allergies.     fluticasone (FLONASE) 50 MCG/ACT nasal spray Place 1 spray into both nostrils daily as needed for allergies or rhinitis.     ibuprofen (ADVIL,MOTRIN) 800 MG tablet Take 800 mg by mouth every 8 (eight) hours as needed. pain     Linoleic Acid-Sunflower Oil (CLA PO) Take by mouth.     lisinopril-hydrochlorothiazide (ZESTORETIC) 10-12.5 MG tablet Take 1 tablet by mouth daily.     magnesium oxide (MAG-OX) 400 MG tablet Take 400 mg by mouth daily.     melatonin 5 MG TABS Take 5 mg by mouth at bedtime as needed (sleep).     pantoprazole (PROTONIX) 40 MG tablet Take 40 mg by mouth daily.     escitalopram (LEXAPRO) 10 MG tablet Take 10  mg by mouth at bedtime. (Patient not taking: Reported on 10/28/2022)     Multiple Vitamins-Minerals (EYE HEALTH + LUTEIN PO) Take 1 tablet by mouth in the morning and at bedtime. (Patient not taking: Reported on 10/28/2022)     OVER THE COUNTER MEDICATION Take 2 capsules by mouth daily. Brain Support     No current facility-administered medications for this visit.    Past Medical History:  Diagnosis Date   Asthma    Hypertension    SVT (supraventricular tachycardia)     Past Surgical History:  Procedure Laterality Date   CESAREAN SECTION     COLONOSCOPY N/A 08/20/2015   Procedure: COLONOSCOPY;  Surgeon: West Bali,  MD;  Location: [facility];  Service: Endoscopy;  Laterality: N/A;  1045   KNEE SURGERY     right   SVT ABLATION N/A 11/22/2019   Procedure: SVT ABLATION;  Surgeon: Hillis Range, MD;  Location: [facility] INVASIVE CV LAB;  Service: Cardiovascular;  Laterality: N/A;    Family History  Problem Relation Age of Onset   Heart attack Other        grandfather   Colon cancer Neg Hx    Colon polyps Neg Hx    Celiac disease Neg Hx    Pancreatic cancer Neg Hx    Stomach cancer Neg Hx    Ulcerative colitis Neg Hx    Crohn's disease Neg Hx     Allergies as of 10/28/2022   (No Known Allergies)    Social History   Socioeconomic History   Marital status: Single    Spouse name: Not on file   Number of children: Not on file   Years of education: Not on file   Highest education level: Not on file  Occupational History   Occupation: [employer]  Tobacco Use   Smoking status: Every Day    Packs/day: 0.12    Years: 13.00    Additional pack years: 0.00    Total pack years: 1.56    Types: Cigarettes    Start date: 06/30/2000   Smokeless tobacco: Never  Vaping Use   Vaping Use: Never used  Substance and Sexual Activity   Alcohol use: Yes    Alcohol/week: 0.0 standard drinks of alcohol    Comment: Drinks at least 3-4 times a week, vodka and cranberry, normally after work    Drug use: No   Sexual activity: Not on file  Other Topics Concern   Not on file  Social History Narrative   Lives in [place] with fiance and son   Works in Presenter, broadcasting at [employer].   Social Determinants of Corporate investment banker Strain: Not on [employer] Insecurity: Not on file  Transportation Needs: Not on file  Physical Activity: Not on file  Stress: Not on file  Social Connections: Not on file  Intimate Partner Violence: Not on file     Review of Systems   Gen: + weight gain. Denies any fever, chills, fatigue, weight loss, lack of appetite.  CV: Denies chest pain, heart palpitations,  peripheral edema, syncope.  Resp: Denies shortness of breath at rest or with exertion. Denies wheezing or cough.  GI: see HPI GU : Denies urinary burning, urinary frequency, urinary hesitancy MS: Denies joint pain, muscle weakness, cramps, or limitation of movement.  Derm: Denies rash, itching, dry skin Psych: Denies depression, anxiety, memory loss, and confusion Heme: Denies bruising, bleeding, and enlarged lymph nodes.   Physical Exam  BP 109/73 (BP Location: Left Arm, Patient Position: Sitting, Cuff Size: Large)   Pulse (!) 109   Temp (!) 97.5 F (36.4 C) (Temporal)   Ht 5' 4.75" (1.645 m)   Wt 190 lb 8 oz (86.4 kg)   BMI 31.95 kg/m   General:   Alert and oriented. Pleasant and cooperative. Well-nourished and well-developed.  Head:  Normocephalic and atraumatic. Eyes:  Without icterus, sclera clear and conjunctiva pink.  Ears:  Normal auditory acuity. Mouth:  No deformity or lesions, oral mucosa pink.  Lungs:  Clear to auscultation bilaterally. No wheezes, rales, or rhonchi. No distress.  Heart:  S1, S2 present without murmurs appreciated.  Abdomen:  +BS, soft, non-distended. Mild ttp to epigastrium. Minimal ttp to LUQ. No HSM noted. No guarding or rebound. No masses appreciated.  Rectal:  Deferred  Msk:  Symmetrical without gross deformities. Normal posture. Extremities:  Without edema. Neurologic:  Alert and  oriented x4;  grossly normal neurologically. Skin:  Intact without significant lesions or rashes. Psych:  Alert and cooperative. Normal mood and affect.   Assessment   Rhonda Patel is a 57 y.o. female with a history of HTN, SVT, asthma, IBS mixed, and GERD presenting today for evaluation of GERD, bloating, and epigastric pain.   GERD, epigastric pain, bloating: Reports typical GERD symptoms controlled on once daily PPI however has mid-sternal chest pain and bloating. Trialed bland diet for 2 weeks but still ate chips and salsa and other Hispanic foods with a  vomiting episode 2 hours after.  No weight loss. Admits to drinking about 5 liquor drinks a week, sometimes more and regular tobacco use. Most significant complaint is bloating and perception that her stomach looks abnormal. Currently taking acidophilus but currently out of her probiotic which seems to help with bloating. Advised gas ex and mylanta as needed as well as probiotic. Will further evaluate symptoms with an EGD for evaluation of esophagitis, duodenitis, gastritis. Will increase PPI from once daily to twice daily. Will obtain labs including tsh and celiac panel to assess for etiology of pain and bloating.   IBS mixed: Has alternating bowel habits at baseline but most recently in a diarrhea flare. Before this flare she was experiencing more constipation.    PLAN   Pantoprazole 40 mg BID Probiotic CBC, CMP, TSH, celiac panel Proceed with upper endoscopy with propofol by Dr. Marletta Lor in near future: the risks, benefits, and alternatives have been discussed with the patient in detail. The patient states understanding and desires to proceed. ASA 2 BMP Gas ex as needed Mylanta as needed Follow-up in 2 months   Brooke Bonito, MSN, FNP-BC, AGACNP-BC [facility]

## 2022-10-28 NOTE — Patient Instructions (Signed)
We are scheduling you for an upper endoscopy in the near future with Dr. Marletta Lor.  Increase your pantoprazole to 40 mg twice daily.  Take 1 tablet 30 minutes prior to breakfast and dinner.  Please resume your probiotic.  You may also use Gas-X as needed for bloating.  For any epigastric discomfort/pain you may try Mylanta for relief.  Please have your labs completed at [facility].  It was a pleasure to see you today. I want to create trusting relationships with patients. If you receive a survey regarding your visit,  I greatly appreciate you taking time to fill this out on paper or through your MyChart. I value your feedback.  Brooke Bonito, MSN, FNP-BC, AGACNP-BC [facility]

## 2022-10-29 ENCOUNTER — Telehealth (INDEPENDENT_AMBULATORY_CARE_PROVIDER_SITE_OTHER): Payer: Self-pay | Admitting: Internal Medicine

## 2022-10-29 ENCOUNTER — Encounter: Payer: Self-pay | Admitting: Gastroenterology

## 2022-10-29 ENCOUNTER — Other Ambulatory Visit (HOSPITAL_COMMUNITY)
Admission: RE | Admit: 2022-10-29 | Discharge: 2022-10-29 | Disposition: A | Discharge status: Home / Self Care | Payer: Medicaid Other | Source: Ambulatory Visit | Attending: Internal Medicine | Admitting: Internal Medicine

## 2022-10-29 DIAGNOSIS — K219 Gastro-esophageal reflux disease without esophagitis: Secondary | ICD-10-CM | POA: Insufficient documentation

## 2022-10-29 DIAGNOSIS — R1013 Epigastric pain: Secondary | ICD-10-CM | POA: Diagnosis present

## 2022-10-29 DIAGNOSIS — R140 Abdominal distension (gaseous): Secondary | ICD-10-CM | POA: Insufficient documentation

## 2022-10-29 LAB — BASIC METABOLIC PANEL
Anion gap: 11 (ref 5–15)
BUN: 18 mg/dL (ref 6–20)
CO2: 25 mmol/L (ref 22–32)
Calcium: 9.1 mg/dL (ref 8.9–10.3)
Chloride: 102 mmol/L (ref 98–111)
Creatinine, Ser: 0.79 mg/dL (ref 0.44–1.00)
GFR, Estimated: >60 mL/min (ref >60)
Glucose, Bld: 108 mg/dL — ABNORMAL HIGH (ref 70–99)
Potassium: 3.4 mmol/L — ABNORMAL LOW (ref 3.5–5.1)
Sodium: 138 mmol/L (ref 135–145)

## 2022-10-29 NOTE — Telephone Encounter (Signed)
Per [facility]-no authorization needed for EGD.

## 2022-10-31 ENCOUNTER — Ambulatory Visit (HOSPITAL_COMMUNITY)
Admission: RE | Admit: 2022-10-31 | Discharge: 2022-10-31 | Disposition: A | Discharge status: Home / Self Care | Payer: Medicaid Other | Attending: Internal Medicine | Admitting: Internal Medicine

## 2022-10-31 ENCOUNTER — Encounter (HOSPITAL_COMMUNITY): Payer: Self-pay

## 2022-10-31 ENCOUNTER — Encounter (HOSPITAL_COMMUNITY)
Admission: RE | Disposition: A | Discharge status: Home / Self Care | Payer: Self-pay | Source: Home / Self Care | Attending: Internal Medicine

## 2022-10-31 ENCOUNTER — Other Ambulatory Visit: Payer: Self-pay

## 2022-10-31 ENCOUNTER — Ambulatory Visit (HOSPITAL_COMMUNITY): Payer: Medicaid Other | Admitting: Anesthesiology

## 2022-10-31 ENCOUNTER — Ambulatory Visit (HOSPITAL_BASED_OUTPATIENT_CLINIC_OR_DEPARTMENT_OTHER): Payer: Medicaid Other | Admitting: Anesthesiology

## 2022-10-31 DIAGNOSIS — I10 Essential (primary) hypertension: Secondary | ICD-10-CM

## 2022-10-31 DIAGNOSIS — F17210 Nicotine dependence, cigarettes, uncomplicated: Secondary | ICD-10-CM | POA: Diagnosis not present

## 2022-10-31 DIAGNOSIS — J45909 Unspecified asthma, uncomplicated: Secondary | ICD-10-CM | POA: Insufficient documentation

## 2022-10-31 DIAGNOSIS — K449 Diaphragmatic hernia without obstruction or gangrene: Secondary | ICD-10-CM

## 2022-10-31 DIAGNOSIS — K2289 Other specified disease of esophagus: Secondary | ICD-10-CM

## 2022-10-31 DIAGNOSIS — K222 Esophageal obstruction: Secondary | ICD-10-CM | POA: Insufficient documentation

## 2022-10-31 DIAGNOSIS — K2970 Gastritis, unspecified, without bleeding: Secondary | ICD-10-CM | POA: Insufficient documentation

## 2022-10-31 DIAGNOSIS — K219 Gastro-esophageal reflux disease without esophagitis: Secondary | ICD-10-CM | POA: Diagnosis not present

## 2022-10-31 HISTORY — PX: BIOPSY: SHX5522

## 2022-10-31 HISTORY — PX: ESOPHAGOGASTRODUODENOSCOPY (EGD) WITH PROPOFOL: SHX5813

## 2022-10-31 SURGERY — ESOPHAGOGASTRODUODENOSCOPY (EGD) WITH PROPOFOL
Anesthesia: General

## 2022-10-31 MED ORDER — PROPOFOL 10 MG/ML IV BOLUS
INTRAVENOUS | Status: DC | PRN
  Administered 2022-10-31: 40 mg via INTRAVENOUS
  Administered 2022-10-31: 200 mg via INTRAVENOUS

## 2022-10-31 MED ORDER — LACTATED RINGERS IV SOLN: INTRAVENOUS | Status: DC

## 2022-10-31 MED ORDER — LACTATED RINGERS IV SOLN
INTRAVENOUS | Status: DC
  Administered 2022-10-31: 1000 mL via INTRAVENOUS

## 2022-10-31 MED ORDER — LIDOCAINE HCL (CARDIAC) PF 100 MG/5ML IV SOSY
PREFILLED_SYRINGE | INTRAVENOUS | Status: DC | PRN
  Administered 2022-10-31: 60 mg via INTRAVENOUS

## 2022-10-31 NOTE — Discharge Instructions (Addendum)
EGD Discharge instructions Please read the instructions outlined below and refer to this sheet in the next few weeks. These discharge instructions provide you with general information on caring for yourself after you leave the hospital. Your doctor may also give you specific instructions. While your treatment has been planned according to the most current medical practices available, unavoidable complications occasionally occur. If you have any problems or questions after discharge, please call your doctor. ACTIVITY You may resume your regular activity but move at a slower pace for the next 24 hours.  Take frequent rest periods for the next 24 hours.  Walking will help expel (get rid of) the air and reduce the bloated feeling in your abdomen.  No driving for 24 hours (because of the anesthesia (medicine) used during the test).  You may shower.  Do not sign any important legal documents or operate any machinery for 24 hours (because of the anesthesia used during the test).  NUTRITION Drink plenty of fluids.  You may resume your normal diet.  Begin with a light meal and progress to your normal diet.  Avoid alcoholic beverages for 24 hours or as instructed by your caregiver.  MEDICATIONS You may resume your normal medications unless your caregiver tells you otherwise.  WHAT YOU CAN EXPECT TODAY You may experience abdominal discomfort such as a feeling of fullness or "gas" pains.  FOLLOW-UP Your doctor will discuss the results of your test with you.  SEEK IMMEDIATE MEDICAL ATTENTION IF ANY OF THE FOLLOWING OCCUR: Excessive nausea (feeling sick to your stomach) and/or vomiting.  Severe abdominal pain and distention (swelling).  Trouble swallowing.  Temperature over 101 F (37.8 C).  Rectal bleeding or vomiting of blood.   Your upper endoscopy revealed a hiatal hernia and mild esophagitis which I took samples of.  You also had inflammation in your stomach which I took biopsies of to rule out  infection with a bacteria called H. pylori.  Small bowel appeared normal.  Continue on pantoprazole twice daily.  We may make adjustments to this, though will discuss further in clinic.   Follow-up in GI office in 2 to 3 months.   OFFICE WILL CONTACT OR YOU CAN  CHECK YOUR MYCHART ACCOUNT   I hope you have a great rest of your week!  Hennie Duos. Marletta Lor, D.O. Gastroenterology and Hepatology [facility]

## 2022-10-31 NOTE — Op Note (Signed)
[facility] Patient Name: Rhonda Patel Procedure Date: 10/31/2022 12:12 PM MRN: [id number] Date of Birth: [birth year] Attending MD: Hennie Duos. Marletta Lor , [place], [id number] CSN: [id number] Age: 57 Admit Type: Outpatient Procedure:                Upper GI endoscopy Indications:              Epigastric abdominal pain, Heartburn Providers:                Hennie Duos. Marletta Lor, DO, Jessica Boudreaux, Burke Keels, Technician Referring MD:              Medicines:                See the Anesthesia note for documentation of the                            administered medications Complications:            No immediate complications. Estimated Blood Loss:     Estimated blood loss was minimal. Procedure:                Pre-Anesthesia Assessment:                           - The anesthesia plan was to use monitored                            anesthesia care (MAC).                           After obtaining informed consent, the endoscope was                            passed under direct vision. Throughout the                            procedure, the patient's blood pressure, pulse, and                            oxygen saturations were monitored continuously. The                            [id number]-H190 ([id number]) scope was introduced through the                            mouth, and advanced to the second part of duodenum.                            The upper GI endoscopy was accomplished without                            difficulty. The patient tolerated the procedure                            well. Scope In: 12:25:41  PM Scope Out: 12:30:36 PM Total Procedure Duration: 0 hours 4 minutes 55 seconds  Findings:      A 3 cm hiatal hernia was present.      A mild Schatzki ring was found in the distal esophagus.      The Z-line was irregular. Biopsies were taken with a cold forceps for       histology.      Patchy mild inflammation characterized by erythema was found in the        gastric body and in the gastric antrum. Biopsies were taken with a cold       forceps for Helicobacter pylori testing.      The duodenal bulb, first portion of the duodenum and second portion of       the duodenum were normal. Impression:               - 3 cm hiatal hernia.                           - Mild Schatzki ring.                           - Z-line irregular. Biopsied.                           - Gastritis. Biopsied.                           - Normal duodenal bulb, first portion of the                            duodenum and second portion of the duodenum. Moderate Sedation:      Per Anesthesia Care Recommendation:           - Patient has a contact number available for                            emergencies. The signs and symptoms of potential                            delayed complications were discussed with the                            patient. Return to normal activities tomorrow.                            Written discharge instructions were provided to the                            patient.                           - Resume previous diet.                           - Continue present medications.                           - Await pathology results.                           -  Use a proton pump inhibitor PO BID.                           - Return to GI office in 3 months.                           - Patient without complaints of dysphagia so                            elected not to dilate as dilation not discussed                            prior to procedure. Can consider repeat EGD with                            dilation if she develops symptoms. Procedure Code(s):        --- Professional ---                           [phone], Esophagogastroduodenoscopy, flexible,                            transoral; with biopsy, single or multiple Diagnosis Code(s):        --- Professional ---                           K44.9, Diaphragmatic hernia without obstruction or                             gangrene                           K22.2, Esophageal obstruction                           K22.89, Other specified disease of esophagus                           K29.70, Gastritis, unspecified, without bleeding                           R10.13, Epigastric pain                           R12, Heartburn CPT copyright 2022 American Medical Association. All rights reserved. The codes documented in this report are preliminary and upon coder review may  be revised to meet current compliance requirements. Hennie Duos. Marletta Lor, DO Hennie Duos. Marletta Lor, DO 10/31/2022 12:37:43 PM This report has been signed electronically. Number of Addenda: 0

## 2022-10-31 NOTE — Transfer of Care (Signed)
Immediate Anesthesia Transfer of Care Note  Patient: Rhonda Patel  Procedure(s) Performed: ESOPHAGOGASTRODUODENOSCOPY (EGD) WITH PROPOFOL BIOPSY  Patient Location: Endoscopy Unit  Anesthesia Type:General  Level of Consciousness: awake and patient cooperative  Airway & Oxygen Therapy: Patient Spontanous Breathing  Post-op Assessment: Report given to RN and Post -op Vital signs reviewed and stable  Post vital signs: Reviewed and stable  Last Vitals:  Vitals Value Taken Time  BP 100/55 10/31/22 1235  Temp 36.8 C 10/31/22 1235  Pulse 92 10/31/22 1235  Resp 22 10/31/22 1235  SpO2 97 % 10/31/22 1235    Last Pain:  Vitals:   10/31/22 1221  TempSrc:   PainSc: 5       Patients Stated Pain Goal: 7 (10/31/22 1152)  Complications: No notable events documented.

## 2022-10-31 NOTE — Anesthesia Procedure Notes (Signed)
Date/Time: 10/31/2022 12:19 PM  Performed by: Franco Nones, CRNAPre-anesthesia Checklist: Patient identified, Emergency Drugs available, Suction available, Timeout performed and Patient being monitored Patient Re-evaluated:Patient Re-evaluated prior to induction Oxygen Delivery Method: Nasal Cannula

## 2022-10-31 NOTE — Anesthesia Preprocedure Evaluation (Signed)
Anesthesia Evaluation  Patient identified by MRN, date of birth, ID band Patient awake    Reviewed: Allergy & Precautions, H&P , NPO status , Patient's Chart, lab work & pertinent test results, reviewed documented beta blocker date and time   Airway Mallampati: II  TM Distance: >3 FB Neck ROM: full    Dental no notable dental hx.    Pulmonary neg pulmonary ROS, asthma , Current Smoker   Pulmonary exam normal breath sounds clear to auscultation       Cardiovascular Exercise Tolerance: Good hypertension, negative cardio ROS  Rhythm:regular Rate:Normal     Neuro/Psych negative neurological ROS  negative psych ROS   GI/Hepatic negative GI ROS, Neg liver ROS,GERD  ,,  Endo/Other  negative endocrine ROS    Renal/GU negative Renal ROS  negative genitourinary   Musculoskeletal   Abdominal   Peds  Hematology negative hematology ROS (+)   Anesthesia Other Findings   Reproductive/Obstetrics negative OB ROS                             Anesthesia Physical Anesthesia Plan  ASA: 2  Anesthesia Plan: General   Post-op Pain Management:    Induction:   PONV Risk Score and Plan: Propofol infusion  Airway Management Planned:   Additional Equipment:   Intra-op Plan:   Post-operative Plan:   Informed Consent: I have reviewed the patients History and Physical, chart, labs and discussed the procedure including the risks, benefits and alternatives for the proposed anesthesia with the patient or authorized representative who has indicated his/her understanding and acceptance.     Dental Advisory Given  Plan Discussed with: CRNA  Anesthesia Plan Comments:        Anesthesia Quick Evaluation

## 2022-10-31 NOTE — H&P (Signed)
Primary Care Physician:  Ignatius Specking, MD Primary Gastroenterologist:  Dr. Marletta Lor  Pre-Procedure History & Physical: HPI:  Rhonda Patel is a 57 y.o. female is here for an EGD to be performed for GERD and epigastric pain  Past Medical History:  Diagnosis Date   Asthma    Hypertension    SVT (supraventricular tachycardia)     Past Surgical History:  Procedure Laterality Date   CESAREAN SECTION     COLONOSCOPY N/A 08/20/2015   Procedure: COLONOSCOPY;  Surgeon: West Bali, MD;  [facility];  Service: Endoscopy;  Laterality: N/A;  1045   KNEE SURGERY     right   SVT ABLATION N/A 11/22/2019   Procedure: SVT ABLATION;  Surgeon: Hillis Range, MD;  [facility];  Service: Cardiovascular;  Laterality: N/A;    Prior to Admission medications   Medication Sig Start Date End Date Taking? Authorizing Provider  2-Fucosyllact-Lacto-N-neotetra (CULTURELLE IBS COMPLETE) PACK Take 1 packet by mouth daily as needed (digestive health).   Yes [provider]  acetaminophen-caffeine (EXCEDRIN TENSION HEADACHE) 500-65 MG TABS per tablet Take 2 tablets by mouth daily as needed (pain).   Yes [provider]  carboxymethylcellulose (REFRESH TEARS) 0.5 % SOLN Place 1 drop into both eyes 3 (three) times daily as needed (dry eyes).   Yes [provider]  cetirizine (ZYRTEC) 10 MG tablet Take 10 mg by mouth daily as needed for allergies.   Yes [provider]  diphenhydramine-acetaminophen (TYLENOL PM) 25-500 MG TABS tablet Take 1 tablet by mouth at bedtime as needed (sleep/pain).   Yes [provider]  fluticasone (FLONASE) 50 MCG/ACT nasal spray Place 1 spray into both nostrils daily as needed for allergies or rhinitis.   Yes [provider]  lisinopril-hydrochlorothiazide (ZESTORETIC) 10-12.5 MG tablet Take 1 tablet by mouth daily. 10/22/22  Yes [provider]  pantoprazole (PROTONIX) 40 MG tablet Take 40 mg by  mouth 2 (two) times daily. 10/27/22  Yes [provider]  Probiotic Product (PROBIOTIC PO) Take 1 capsule by mouth daily as needed (digestive health).    [provider]    Allergies as of 10/28/2022   (No Known Allergies)    Family History  Problem Relation Age of Onset   Heart attack Other        grandfather   Colon cancer Neg Hx    Colon polyps Neg Hx    Celiac disease Neg Hx    Pancreatic cancer Neg Hx    Stomach cancer Neg Hx    Ulcerative colitis Neg Hx    Crohn's disease Neg Hx     Social History   Socioeconomic History   Marital status: Single    Spouse name: Not on file   Number of children: Not on file   Years of education: Not on file   Highest education level: Not on file  Occupational History   Occupation: [employer]  Tobacco Use   Smoking status: Every Day    Packs/day: 0.12    Years: 13.00    Additional pack years: 0.00    Total pack years: 1.56    Types: Cigarettes    Start date: 06/30/2000   Smokeless tobacco: Never  Vaping Use   Vaping Use: Never used  Substance and Sexual Activity   Alcohol use: Yes    Alcohol/week: 0.0 standard drinks of alcohol    Comment: Drinks at least 3-4 times a week, vodka and cranberry, normally after  work    Drug use: No   Sexual activity: Not on file  Other Topics Concern   Not on file  Social History Narrative   Lives in [place] with fiance and son   Works in Presenter, broadcasting at [employer].   Social Determinants of Corporate investment banker Strain: Not on [employer] Insecurity: Not on file  Transportation Needs: Not on file  Physical Activity: Not on file  Stress: Not on file  Social Connections: Not on file  Intimate Partner Violence: Not on file    Review of Systems: General: Negative for fever, chills, fatigue, weakness. Eyes: Negative for vision changes.  ENT: Negative for hoarseness, difficulty swallowing , nasal congestion. CV: Negative for chest pain, angina,  palpitations, dyspnea on exertion, peripheral edema.  Respiratory: Negative for dyspnea at rest, dyspnea on exertion, cough, sputum, wheezing.  GI: See history of present illness. GU:  Negative for dysuria, hematuria, urinary incontinence, urinary frequency, nocturnal urination.  MS: Negative for joint pain, low back pain.  Derm: Negative for rash or itching.  Neuro: Negative for weakness, abnormal sensation, seizure, frequent headaches, memory loss, confusion.  Psych: Negative for anxiety, depression Endo: Negative for unusual weight change.  Heme: Negative for bruising or bleeding. Allergy: Negative for rash or hives.  Physical Exam: Vital signs in last 24 hours: Temp:  [98.4 F (36.9 C)] 98.4 F (36.9 C) (05/03 1152) Pulse Rate:  [82] 82 (05/03 1152) Resp:  [18] 18 (05/03 1152) BP: (120)/(72) 120/72 (05/03 1152) SpO2:  [98 %] 98 % (05/03 1152) Weight:  [86.2 kg] 86.2 kg (05/03 1152)   General:   Alert,  Well-developed, well-nourished, pleasant and cooperative in NAD Head:  Normocephalic and atraumatic. Eyes:  Sclera clear, no icterus.   Conjunctiva pink. Ears:  Normal auditory acuity. Nose:  No deformity, discharge,  or lesions. Msk:  Symmetrical without gross deformities. Normal posture. Extremities:  Without clubbing or edema. Neurologic:  Alert and  oriented x4;  grossly normal neurologically. Skin:  Intact without significant lesions or rashes. Psych:  Alert and cooperative. Normal mood and affect.   Impression/Plan: Rhonda Patel is here for an EGD to be performed for GERD and epigastric pain  Risks, benefits, limitations, imponderables and alternatives regarding EGD have been reviewed with the patient. Questions have been answered. All parties agreeable.

## 2022-11-02 NOTE — Anesthesia Postprocedure Evaluation (Signed)
Anesthesia Post Note  Patient: Rhonda Patel  Procedure(s) Performed: ESOPHAGOGASTRODUODENOSCOPY (EGD) WITH PROPOFOL BIOPSY  Patient location during evaluation: Phase II Anesthesia Type: General Level of consciousness: awake Pain management: pain level controlled Vital Signs Assessment: post-procedure vital signs reviewed and stable Respiratory status: spontaneous breathing and respiratory function stable Cardiovascular status: blood pressure returned to baseline and stable Postop Assessment: no headache and no apparent nausea or vomiting Anesthetic complications: no Comments: Late entry   No notable events documented.   Last Vitals:  Vitals:   10/31/22 1235 10/31/22 1237  BP: (!) 100/55   Pulse: 92 94  Resp: (!) 22 16  Temp: 36.8 C   SpO2: 97% 97%    Last Pain:  Vitals:   10/31/22 1221  TempSrc:   PainSc: 5                  Windell Norfolk

## 2022-11-03 LAB — SURGICAL PATHOLOGY

## 2022-11-07 ENCOUNTER — Encounter (HOSPITAL_COMMUNITY): Payer: Self-pay | Admitting: Internal Medicine

## 2022-12-29 ENCOUNTER — Encounter: Payer: Self-pay | Admitting: Gastroenterology

## 2022-12-29 ENCOUNTER — Ambulatory Visit: Payer: Medicaid Other | Admitting: Gastroenterology

## 2022-12-29 NOTE — Progress Notes (Deleted)
GI Office Note    Referring Provider: Ignatius Specking, MD Primary Care Physician:  Ignatius Specking, MD Primary Gastroenterologist: Hennie Duos. Marletta Lor, DO  Date:  12/29/2022  ID:  Rhonda Patel, DOB Jun 25, 1966, MRN [id number]   Chief Complaint   No chief complaint on file.   History of Present Illness  Rhonda Patel is a 57 y.o. female with a history of *** presenting today with complaint of   Colonoscopy 08/20/15: -3 colon polyps -Mild ascending diverticulosis and cecal diverticulosis -Moderate internal hemorrhoids -Pathology revealed inflamed polyp and hyperplastic polyp -Recommended repeat in 10 years  Last office visit 10/28/22. *** PPI BID. Start probiotic. Check labs including TSH and celiac panel. Gas ex and mylanta as needed. Schedule EGD.   Labs not performed by patient.   EGD 10/31/22: -3 hiatal hernia -mild schatzki ring -irregular Zline of esophagus s/p biopsy -gastritis s/p biopsy -normal duodenum -biopsies with mild reactive changes, GE biopsy negative for H. Pylori or barretts.  -PPI BID   Current Outpatient Medications  Medication Sig Dispense Refill   2-Fucosyllact-Lacto-N-neotetra (CULTURELLE IBS COMPLETE) PACK Take 1 packet by mouth daily as needed (digestive health).     acetaminophen-caffeine (EXCEDRIN TENSION HEADACHE) 500-65 MG TABS per tablet Take 2 tablets by mouth daily as needed (pain).     carboxymethylcellulose (REFRESH TEARS) 0.5 % SOLN Place 1 drop into both eyes 3 (three) times daily as needed (dry eyes).     cetirizine (ZYRTEC) 10 MG tablet Take 10 mg by mouth daily as needed for allergies.     diphenhydramine-acetaminophen (TYLENOL PM) 25-500 MG TABS tablet Take 1 tablet by mouth at bedtime as needed (sleep/pain).     fluticasone (FLONASE) 50 MCG/ACT nasal spray Place 1 spray into both nostrils daily as needed for allergies or rhinitis.     lisinopril-hydrochlorothiazide (ZESTORETIC) 10-12.5 MG tablet Take 1 tablet by mouth daily.      pantoprazole (PROTONIX) 40 MG tablet Take 40 mg by mouth 2 (two) times daily.     Probiotic Product (PROBIOTIC PO) Take 1 capsule by mouth daily as needed (digestive health).     No current facility-administered medications for this visit.    Past Medical History:  Diagnosis Date   Asthma    Hypertension    SVT (supraventricular tachycardia)     Past Surgical History:  Procedure Laterality Date   BIOPSY  10/31/2022   Procedure: BIOPSY;  Surgeon: Lanelle Bal, DO;  [facility];  Service: Endoscopy;;   CESAREAN SECTION     COLONOSCOPY N/A 08/20/2015   Procedure: COLONOSCOPY;  Surgeon: West Bali, MD;  [facility];  Service: Endoscopy;  Laterality: N/A;  1045   ESOPHAGOGASTRODUODENOSCOPY (EGD) WITH PROPOFOL N/A 10/31/2022   Procedure: ESOPHAGOGASTRODUODENOSCOPY (EGD) WITH PROPOFOL;  Surgeon: Lanelle Bal, DO;  [facility];  Service: Endoscopy;  Laterality: N/A;  1:00PM;ASA 2   KNEE SURGERY     right   SVT ABLATION N/A 11/22/2019   Procedure: SVT ABLATION;  Surgeon: Hillis Range, MD;  [facility];  Service: Cardiovascular;  Laterality: N/A;    Family History  Problem Relation Age of Onset   Heart attack Other        grandfather   Colon cancer Neg Hx    Colon polyps Neg Hx    Celiac disease Neg Hx    Pancreatic cancer Neg Hx    Stomach cancer Neg Hx    Ulcerative colitis Neg Hx  Crohn's disease Neg Hx     Allergies as of 12/29/2022   (No Known Allergies)    Social History   Socioeconomic History   Marital status: Single    Spouse name: Not on file   Number of children: Not on file   Years of education: Not on file   Highest education level: Not on file  Occupational History   Occupation: [employer]  Tobacco Use   Smoking status: Every Day    Packs/day: 0.12    Years: 13.00    Additional pack years: 0.00    Total pack years: 1.56    Types: Cigarettes    Start date: 06/30/2000   Smokeless tobacco:  Never  Vaping Use   Vaping Use: Never used  Substance and Sexual Activity   Alcohol use: Yes    Alcohol/week: 0.0 standard drinks of alcohol    Comment: Drinks at least 3-4 times a week, vodka and cranberry, normally after work    Drug use: No   Sexual activity: Not on file  Other Topics Concern   Not on file  Social History Narrative   Lives in [place] with fiance and son   Works in Presenter, broadcasting at [employer].   Social Determinants of Corporate investment banker Strain: Not on [employer] Insecurity: Not on file  Transportation Needs: Not on file  Physical Activity: Not on file  Stress: Not on file  Social Connections: Not on file     Review of Systems   Gen: Denies fever, chills, anorexia. Denies fatigue, weakness, weight loss.  CV: Denies chest pain, palpitations, syncope, peripheral edema, and claudication. Resp: Denies dyspnea at rest, cough, wheezing, coughing up blood, and pleurisy. GI: See HPI Derm: Denies rash, itching, dry skin Psych: Denies depression, anxiety, memory loss, confusion. No homicidal or suicidal ideation.  Heme: Denies bruising, bleeding, and enlarged lymph nodes.   Physical Exam   LMP 11/08/2019 (Approximate)   General:   Alert and oriented. No distress noted. Pleasant and cooperative.  Head:  Normocephalic and atraumatic. Eyes:  Conjuctiva clear without scleral icterus. Mouth:  Oral mucosa pink and moist. Good dentition. No lesions. Lungs:  Clear to auscultation bilaterally. No wheezes, rales, or rhonchi. No distress.  Heart:  S1, S2 present without murmurs appreciated.  Abdomen:  +BS, soft, non-tender and non-distended. No rebound or guarding. No HSM or masses noted. Rectal: *** Msk:  Symmetrical without gross deformities. Normal posture. Extremities:  Without edema. Neurologic:  Alert and  oriented x4 Psych:  Alert and cooperative. Normal mood and affect.   Assessment  Rhonda Patel is a 57 y.o. female with a history of  *** presenting today with    PLAN   *** Xifaxin for IBS-D?  Sucrase deficiency testing? Avoid dairy and common gas producing foods     Brooke Bonito, MSN, FNP-BC, AGACNP-BC [facility]

## 2023-01-07 NOTE — Progress Notes (Deleted)
GI Office Note    Referring Provider: Ignatius Specking, MD Primary Care Physician:  Ignatius Specking, MD Primary Gastroenterologist: ***  Date:  01/07/2023  ID:  Rhonda Patel, DOB February 21, 1966, MRN [id number]   Chief Complaint   No chief complaint on file.  History of Present Illness  Rhonda Patel is a 57 y.o. female with a history of mixed IBS, GERD, HTN, SVT, and asthma presenting today for follow-up of epigastric pain, and GERD.  Colonoscopy 08/20/15: -3 colon polyps -Mild ascending diverticulosis and cecal diverticulosis -Moderate internal hemorrhoids -Pathology revealed inflamed polyp and hyperplastic polyp -Recommended repeat in 10 years   Last office visit 10/28/22. Reports GERD symptoms controlled with pantoprazole once daily but has been.  Placed herself on bland diet for 2 weeks to allow for observation but with forcing herself to eat she would only have a small amount of food.  Pain to palpation in the epigastric region.  Appetite loss.  Language she had chips and salsa and vomited 2 hours after meal.  Biggest complaint has been severe bloating.  Sometimes takes acidophilus but has been out of her probiotic recently.  Alternating back-and-forth from constipation and loose stools but most recently had been having loose stools.  Admits to smoking daily.  Did admit to intermittent alcohol use.  Recommendations: PPI BID. Start probiotic. Check labs including TSH and celiac panel. Gas ex and mylanta as needed. Scheduled for EGD.    Labs not performed by patient.    EGD 10/31/22: -3 hiatal hernia -mild schatzki ring -irregular Zline of esophagus s/p biopsy -gastritis s/p biopsy -normal duodenum -biopsies with mild reactive changes, GE biopsy negative for H. Pylori or barretts.  -PPI BID   Today: Did xifaxin work?  Diarrhea/constipation-   GERD -    Current Outpatient Medications  Medication Sig Dispense Refill   2-Fucosyllact-Lacto-N-neotetra (CULTURELLE IBS COMPLETE)  PACK Take 1 packet by mouth daily as needed (digestive health).     acetaminophen-caffeine (EXCEDRIN TENSION HEADACHE) 500-65 MG TABS per tablet Take 2 tablets by mouth daily as needed (pain).     carboxymethylcellulose (REFRESH TEARS) 0.5 % SOLN Place 1 drop into both eyes 3 (three) times daily as needed (dry eyes).     cetirizine (ZYRTEC) 10 MG tablet Take 10 mg by mouth daily as needed for allergies.     diphenhydramine-acetaminophen (TYLENOL PM) 25-500 MG TABS tablet Take 1 tablet by mouth at bedtime as needed (sleep/pain).     fluticasone (FLONASE) 50 MCG/ACT nasal spray Place 1 spray into both nostrils daily as needed for allergies or rhinitis.     lisinopril-hydrochlorothiazide (ZESTORETIC) 10-12.5 MG tablet Take 1 tablet by mouth daily.     pantoprazole (PROTONIX) 40 MG tablet Take 40 mg by mouth 2 (two) times daily.     Probiotic Product (PROBIOTIC PO) Take 1 capsule by mouth daily as needed (digestive health).     No current facility-administered medications for this visit.    Past Medical History:  Diagnosis Date   Asthma    Hypertension    SVT (supraventricular tachycardia)     Past Surgical History:  Procedure Laterality Date   BIOPSY  10/31/2022   Procedure: BIOPSY;  Surgeon: Lanelle Bal, DO;  [facility];  Service: Endoscopy;;   CESAREAN SECTION     COLONOSCOPY N/A 08/20/2015   Procedure: COLONOSCOPY;  Surgeon: West Bali, MD;  [facility];  Service: Endoscopy;  Laterality: N/A;  1045   ESOPHAGOGASTRODUODENOSCOPY (EGD)  WITH PROPOFOL N/A 10/31/2022   Procedure: ESOPHAGOGASTRODUODENOSCOPY (EGD) WITH PROPOFOL;  Surgeon: Lanelle Bal, DO;  [facility];  Service: Endoscopy;  Laterality: N/A;  1:00PM;ASA 2   KNEE SURGERY     right   SVT ABLATION N/A 11/22/2019   Procedure: SVT ABLATION;  Surgeon: Hillis Range, MD;  [facility];  Service: Cardiovascular;  Laterality: N/A;    Family History  Problem Relation Age  of Onset   Heart attack Other        grandfather   Colon cancer Neg Hx    Colon polyps Neg Hx    Celiac disease Neg Hx    Pancreatic cancer Neg Hx    Stomach cancer Neg Hx    Ulcerative colitis Neg Hx    Crohn's disease Neg Hx     Allergies as of 01/08/2023   (No Known Allergies)    Social History   Socioeconomic History   Marital status: Single    Spouse name: Not on file   Number of children: Not on file   Years of education: Not on file   Highest education level: Not on file  Occupational History   Occupation: [employer]  Tobacco Use   Smoking status: Every Day    Packs/day: 0.12    Years: 13.00    Additional pack years: 0.00    Total pack years: 1.56    Types: Cigarettes    Start date: 06/30/2000   Smokeless tobacco: Never  Vaping Use   Vaping Use: Never used  Substance and Sexual Activity   Alcohol use: Yes    Alcohol/week: 0.0 standard drinks of alcohol    Comment: Drinks at least 3-4 times a week, vodka and cranberry, normally after work    Drug use: No   Sexual activity: Not on file  Other Topics Concern   Not on file  Social History Narrative   Lives in [place] with fiance and son   Works in Presenter, broadcasting at [employer].   Social Determinants of Corporate investment banker Strain: Not on [employer] Insecurity: Not on file  Transportation Needs: Not on file  Physical Activity: Not on file  Stress: Not on file  Social Connections: Not on file     Review of Systems   Gen: Denies fever, chills, anorexia. Denies fatigue, weakness, weight loss.  CV: Denies chest pain, palpitations, syncope, peripheral edema, and claudication. Resp: Denies dyspnea at rest, cough, wheezing, coughing up blood, and pleurisy. GI: See HPI Derm: Denies rash, itching, dry skin Psych: Denies depression, anxiety, memory loss, confusion. No homicidal or suicidal ideation.  Heme: Denies bruising, bleeding, and enlarged lymph nodes.   Physical Exam   LMP  11/08/2019 (Approximate)   General:   Alert and oriented. No distress noted. Pleasant and cooperative.  Head:  Normocephalic and atraumatic. Eyes:  Conjuctiva clear without scleral icterus. Mouth:  Oral mucosa pink and moist. Good dentition. No lesions. Lungs:  Clear to auscultation bilaterally. No wheezes, rales, or rhonchi. No distress.  Heart:  S1, S2 present without murmurs appreciated.  Abdomen:  +BS, soft, non-tender and non-distended. No rebound or guarding. No HSM or masses noted. Rectal: *** Msk:  Symmetrical without gross deformities. Normal posture. Extremities:  Without edema. Neurologic:  Alert and  oriented x4 Psych:  Alert and cooperative. Normal mood and affect.   Assessment  Rhonda Patel is a 57 y.o. female with a history of *** presenting today with  PLAN   *** Xifaxin for IBS-D?  Sucrase deficiency testing? Avoid dairy and common gas producing foods Continue pantoprazole 40mg  once daily    Brooke Bonito, MSN, FNP-BC, AGACNP-BC [facility]

## 2023-01-08 ENCOUNTER — Encounter: Payer: Self-pay | Admitting: Gastroenterology

## 2023-01-08 ENCOUNTER — Ambulatory Visit: Payer: Medicaid Other | Admitting: Gastroenterology

## 2023-02-10 ENCOUNTER — Ambulatory Visit: Payer: Medicaid Other | Admitting: Gastroenterology

## 2023-02-10 ENCOUNTER — Encounter: Payer: Self-pay | Admitting: Gastroenterology

## 2023-02-16 NOTE — Progress Notes (Deleted)
GI Office Note    Referring Provider: Ignatius Specking, MD Primary Care Physician:  Ignatius Specking, MD Primary Gastroenterologist: Hennie Duos. Marletta Lor, DO   Date:  02/16/2023  ID:  Rhonda Patel, DOB 19-Sep-1965, MRN [id number]   Chief Complaint   No chief complaint on file.  History of Present Illness  Rhonda Patel is a 57 y.o. female with a history of mixed IBS, GERD, HTN, SVT, and asthma presenting today for follow-up of epigastric pain, and GERD.   Colonoscopy 08/20/15: -3 colon polyps -Mild ascending diverticulosis and cecal diverticulosis -Moderate internal hemorrhoids -Pathology revealed inflamed polyp and hyperplastic polyp -Recommended repeat in 10 years   Last office visit 10/28/22. Reports GERD symptoms controlled with pantoprazole once daily but has been.  Placed herself on bland diet for 2 weeks to allow for observation but with forcing herself to eat she would only have a small amount of food.  Pain to palpation in the epigastric region.  Appetite loss.  Language she had chips and salsa and vomited 2 hours after meal.  Biggest complaint has been severe bloating.  Sometimes takes acidophilus but has been out of her probiotic recently.  Alternating back-and-forth from constipation and loose stools but most recently had been having loose stools.  Admits to smoking daily.  Did admit to intermittent alcohol use.  Recommendations: PPI BID. Start probiotic. Check labs including TSH and celiac panel. Gas ex and mylanta as needed. Scheduled for EGD.    Labs not performed by patient.    EGD 10/31/22: -3 hiatal hernia -mild schatzki ring -irregular Zline of esophagus s/p biopsy -gastritis s/p biopsy -normal duodenum -biopsies with mild reactive changes, GE biopsy negative for H. Pylori or barretts.  -PPI BID   Today: Did xifaxin work?   Diarrhea/constipation-    GERD -        Current Outpatient Medications  Medication Sig Dispense Refill    2-Fucosyllact-Lacto-N-neotetra (CULTURELLE IBS COMPLETE) PACK Take 1 packet by mouth daily as needed (digestive health).     acetaminophen-caffeine (EXCEDRIN TENSION HEADACHE) 500-65 MG TABS per tablet Take 2 tablets by mouth daily as needed (pain).     carboxymethylcellulose (REFRESH TEARS) 0.5 % SOLN Place 1 drop into both eyes 3 (three) times daily as needed (dry eyes).     cetirizine (ZYRTEC) 10 MG tablet Take 10 mg by mouth daily as needed for allergies.     diphenhydramine-acetaminophen (TYLENOL PM) 25-500 MG TABS tablet Take 1 tablet by mouth at bedtime as needed (sleep/pain).     fluticasone (FLONASE) 50 MCG/ACT nasal spray Place 1 spray into both nostrils daily as needed for allergies or rhinitis.     lisinopril-hydrochlorothiazide (ZESTORETIC) 10-12.5 MG tablet Take 1 tablet by mouth daily.     pantoprazole (PROTONIX) 40 MG tablet Take 40 mg by mouth 2 (two) times daily.     Probiotic Product (PROBIOTIC PO) Take 1 capsule by mouth daily as needed (digestive health).     No current facility-administered medications for this visit.    Past Medical History:  Diagnosis Date   Asthma    Hypertension    SVT (supraventricular tachycardia)     Past Surgical History:  Procedure Laterality Date   BIOPSY  10/31/2022   Procedure: BIOPSY;  Surgeon: Lanelle Bal, DO;  [facility];  Service: Endoscopy;;   CESAREAN SECTION     COLONOSCOPY N/A 08/20/2015   Procedure: COLONOSCOPY;  Surgeon: West Bali, MD;  [facility];  Service: Endoscopy;  Laterality: N/A;  1045   ESOPHAGOGASTRODUODENOSCOPY (EGD) WITH PROPOFOL N/A 10/31/2022   Procedure: ESOPHAGOGASTRODUODENOSCOPY (EGD) WITH PROPOFOL;  Surgeon: Lanelle Bal, DO;  [facility];  Service: Endoscopy;  Laterality: N/A;  1:00PM;ASA 2   KNEE SURGERY     right   SVT ABLATION N/A 11/22/2019   Procedure: SVT ABLATION;  Surgeon: Hillis Range, MD;  [facility];  Service: Cardiovascular;   Laterality: N/A;    Family History  Problem Relation Age of Onset   Heart attack Other        grandfather   Colon cancer Neg Hx    Colon polyps Neg Hx    Celiac disease Neg Hx    Pancreatic cancer Neg Hx    Stomach cancer Neg Hx    Ulcerative colitis Neg Hx    Crohn's disease Neg Hx     Allergies as of 02/17/2023   (No Known Allergies)    Social History   Socioeconomic History   Marital status: Single    Spouse name: Not on file   Number of children: Not on file   Years of education: Not on file   Highest education level: Not on file  Occupational History   Occupation: [employer]  Tobacco Use   Smoking status: Every Day    Current packs/day: 0.12    Average packs/day: 0.1 packs/day for 22.6 years (2.7 ttl pk-yrs)    Types: Cigarettes    Start date: 06/30/2000   Smokeless tobacco: Never  Vaping Use   Vaping status: Never Used  Substance and Sexual Activity   Alcohol use: Yes    Alcohol/week: 0.0 standard drinks of alcohol    Comment: Drinks at least 3-4 times a week, vodka and cranberry, normally after work    Drug use: No   Sexual activity: Not on file  Other Topics Concern   Not on file  Social History Narrative   Lives in [place] with fiance and son   Works in Presenter, broadcasting at [employer].   Social Determinants of Corporate investment banker Strain: Not on [employer] Insecurity: Not on file  Transportation Needs: Not on file  Physical Activity: Not on file  Stress: Not on file  Social Connections: Unknown (11/11/2021)   Received from [facility]   Social Network    Social Network: Not on file     Review of Systems   Gen: Denies fever, chills, anorexia. Denies fatigue, weakness, weight loss.  CV: Denies chest pain, palpitations, syncope, peripheral edema, and claudication. Resp: Denies dyspnea at rest, cough, wheezing, coughing up blood, and pleurisy. GI: See HPI Derm: Denies rash, itching, dry skin Psych: Denies depression, anxiety,  memory loss, confusion. No homicidal or suicidal ideation.  Heme: Denies bruising, bleeding, and enlarged lymph nodes.   Physical Exam   LMP 11/08/2019 (Approximate)   General:   Alert and oriented. No distress noted. Pleasant and cooperative.  Head:  Normocephalic and atraumatic. Eyes:  Conjuctiva clear without scleral icterus. Mouth:  Oral mucosa pink and moist. Good dentition. No lesions. Lungs:  Clear to auscultation bilaterally. No wheezes, rales, or rhonchi. No distress.  Heart:  S1, S2 present without murmurs appreciated.  Abdomen:  +BS, soft, non-tender and non-distended. No rebound or guarding. No HSM or masses noted. Rectal: *** Msk:  Symmetrical without gross deformities. Normal posture. Extremities:  Without edema. Neurologic:  Alert and  oriented x4 Psych:  Alert and cooperative. Normal  mood and affect.   Assessment  Rhonda Patel is a 57 y.o. female with a history of mixed IBS, GERD, HTN, SVT, and asthma presenting today for follow-up of epigastric pain, and GERD.  GERD, epigastric pain, bloating:   IBS mixed:   PLAN   *** Xifaxin for IBS-D?  Sucrase deficiency testing? Avoid dairy and common gas producing foods Continue pantoprazole 40mg  once daily       Brooke Bonito, MSN, FNP-BC, AGACNP-BC [facility]

## 2023-02-17 ENCOUNTER — Ambulatory Visit: Payer: Medicaid Other | Admitting: Gastroenterology

## 2023-02-17 ENCOUNTER — Encounter: Payer: Self-pay | Admitting: Gastroenterology

## 2023-03-09 ENCOUNTER — Telehealth: Payer: Self-pay | Admitting: Gastroenterology

## 2023-03-09 NOTE — Telephone Encounter (Signed)
Patient left message that she needed to reschedule her OV with CM. She has no showed x 4. I called to make OV with CM for 10/1 at 0830 and patient agreed. She has questions about her pantoprazole prescription. She uses [facility]. Please call [phone]

## 2023-03-31 ENCOUNTER — Telehealth: Payer: Self-pay | Admitting: *Deleted

## 2023-03-31 ENCOUNTER — Encounter: Payer: Self-pay | Admitting: Gastroenterology

## 2023-03-31 ENCOUNTER — Other Ambulatory Visit (HOSPITAL_COMMUNITY)
Admission: RE | Admit: 2023-03-31 | Discharge: 2023-03-31 | Disposition: A | Discharge status: Home / Self Care | Payer: Medicaid Other | Source: Ambulatory Visit | Attending: Gastroenterology | Admitting: Gastroenterology

## 2023-03-31 ENCOUNTER — Encounter: Payer: Self-pay | Admitting: *Deleted

## 2023-03-31 ENCOUNTER — Ambulatory Visit (INDEPENDENT_AMBULATORY_CARE_PROVIDER_SITE_OTHER): Payer: Medicaid Other | Admitting: Gastroenterology

## 2023-03-31 VITALS — BP 125/69 | HR 96 | Temp 97.7°F | Ht 64.0 in | Wt 196.8 lb

## 2023-03-31 DIAGNOSIS — R1013 Epigastric pain: Secondary | ICD-10-CM | POA: Insufficient documentation

## 2023-03-31 DIAGNOSIS — R195 Other fecal abnormalities: Secondary | ICD-10-CM | POA: Insufficient documentation

## 2023-03-31 DIAGNOSIS — R140 Abdominal distension (gaseous): Secondary | ICD-10-CM

## 2023-03-31 DIAGNOSIS — K582 Mixed irritable bowel syndrome: Secondary | ICD-10-CM

## 2023-03-31 DIAGNOSIS — K219 Gastro-esophageal reflux disease without esophagitis: Secondary | ICD-10-CM

## 2023-03-31 MED ORDER — PANTOPRAZOLE SODIUM 40 MG PO TBEC: 40.0000 mg | DELAYED_RELEASE_TABLET | Freq: Two times a day (BID) | ORAL | 3 refills | Status: AC

## 2023-03-31 NOTE — Telephone Encounter (Signed)
[facility]  CT scheduled at [facility] on Friday 04/03/23, arrive at 1:45 pm to check in.  Will send mychart message         RADMd PA for CT approved # [id number] DOS: 03/31/23-04/30/23

## 2023-03-31 NOTE — Telephone Encounter (Signed)
Pt informed of Ct appt date, time and location. Also advised pt I sent her a Mychart message with appt .

## 2023-03-31 NOTE — Telephone Encounter (Signed)
RADMD PA for CT A/P: Tracking:[id number] Status: In Review

## 2023-03-31 NOTE — Progress Notes (Signed)
GI Office Note    Referring Provider: Ignatius Specking, MD Primary Care Physician:  Ignatius Specking, MD Primary Gastroenterologist: Hennie Duos. Marletta Lor, DO  Date:  03/31/2023  ID:  KYLAH MARESH, DOB 03/04/1966, MRN [id number]   Chief Complaint   Chief Complaint  Patient presents with   Follow-up    Follow up. Still having some heart burn. Has a pain in the upper stomach area. Coughs at night from acid reflux. IBS more diarrhea than constipation.    History of Present Illness  SONDI DESCH is a 57 y.o. female with a history of mixed IBS, GERD, HTN, SVT, and asthma presenting today for follow-up of epigastric pain, and GERD.   Colonoscopy 08/20/15: -3 colon polyps -Mild ascending diverticulosis and cecal diverticulosis -Moderate internal hemorrhoids -Pathology revealed inflamed polyp and hyperplastic polyp -Recommended repeat in 10 years   Last office visit 10/28/22. Reports GERD symptoms controlled with pantoprazole once daily but has been.  Placed herself on bland diet for 2 weeks to allow for observation but with forcing herself to eat she would only have a small amount of food.  Pain to palpation in the epigastric region.  Appetite loss.  Recent meal she had chips and salsa and vomited 2 hours after meal.  Biggest complaint has been severe bloating.  Sometimes takes acidophilus but has been out of her probiotic recently.  Alternating back-and-forth from constipation and loose stools but most recently had been having loose stools.  Admits to smoking daily.  Did admit to intermittent alcohol use.  Recommendations: PPI BID. Start probiotic. Check labs including TSH and celiac panel. Gas ex and mylanta as needed. Scheduled for EGD.    Labs not performed by patient.    EGD 10/31/22: -3 hiatal hernia -mild schatzki ring -irregular Zline of esophagus s/p biopsy -gastritis s/p biopsy -normal duodenum -biopsies with mild reactive changes, GE biopsy negative for H. Pylori or barretts.  -PPI  BID   Today:  Diarrhea/constipation - Has been taking gas ex when she can and still feels like she is full of air than fat. BM just started being more regular. Diet changes have really helped her bowels. Has been working on decreasing her alcohol use. Not much dairy products. Does eat gas producing foods but not in large amounts.    GERD - States her pcp prescribed her once daily PPI. She states medicaid will only cover it for 30 days. She states the acid reflux has been good. She still has some pain and feels like she has some protrusion that it is very uncomfortable. Feels ike her increased weight has exacerbated that. Has been working on eating smaller quantities and avoid her triggers and modifying her diet to the best of her ability. Has struggled losing weight. If she eats too much it is painful but not with more activity or lifting or carrying groceries the epigastric region gets very aggravated.  Has to use a heating pad   Feels like her abdomen looks deformed for her and she feels las though she is very bloated all the time and would expect to feel more flabby.   Used to have SVT and at that time she was having similar symptoms. At that time she was having lots of gerd and acid reflux. Developed a severe cough around thanksgiving last year and virtual visit  and was treated like a sinus infection and then found out she had bronchitis and even after being treated she continued to have an intermittent  severe cough.    Current Outpatient Medications  Medication Sig Dispense Refill   2-Fucosyllact-Lacto-N-neotetra (CULTURELLE IBS COMPLETE) PACK Take 1 packet by mouth daily as needed (digestive health).     acetaminophen-caffeine (EXCEDRIN TENSION HEADACHE) 500-65 MG TABS per tablet Take 2 tablets by mouth daily as needed (pain).     albuterol (VENTOLIN HFA) 108 (90 Base) MCG/ACT inhaler SMARTSIG:2 Puff(s) By Mouth Every 4-6 Hours PRN     buPROPion (WELLBUTRIN XL) 150 MG 24 hr tablet       carboxymethylcellulose (REFRESH TEARS) 0.5 % SOLN Place 1 drop into both eyes 3 (three) times daily as needed (dry eyes).     diphenhydramine-acetaminophen (TYLENOL PM) 25-500 MG TABS tablet Take 1 tablet by mouth at bedtime as needed (sleep/pain).     fluticasone (FLONASE) 50 MCG/ACT nasal spray Place 1 spray into both nostrils daily as needed for allergies or rhinitis.     levocetirizine (XYZAL) 5 MG tablet Take 5 mg by mouth daily.     lisinopril-hydrochlorothiazide (ZESTORETIC) 10-12.5 MG tablet Take 1 tablet by mouth daily.     pantoprazole (PROTONIX) 40 MG tablet Take 40 mg by mouth 2 (two) times daily.     Probiotic Product (PROBIOTIC PO) Take 1 capsule by mouth daily as needed (digestive health).     cetirizine (ZYRTEC) 10 MG tablet Take 10 mg by mouth daily as needed for allergies.     No current facility-administered medications for this visit.    Past Medical History:  Diagnosis Date   Asthma    Hypertension    SVT (supraventricular tachycardia) [facility])     Past Surgical History:  Procedure Laterality Date   BIOPSY  10/31/2022   Procedure: BIOPSY;  Surgeon: Lanelle Bal, DO;  [facility];  Service: Endoscopy;;   CESAREAN SECTION     COLONOSCOPY N/A 08/20/2015   Procedure: COLONOSCOPY;  Surgeon: West Bali, MD;  [facility];  Service: Endoscopy;  Laterality: N/A;  1045   ESOPHAGOGASTRODUODENOSCOPY (EGD) WITH PROPOFOL N/A 10/31/2022   Procedure: ESOPHAGOGASTRODUODENOSCOPY (EGD) WITH PROPOFOL;  Surgeon: Lanelle Bal, DO;  [facility];  Service: Endoscopy;  Laterality: N/A;  1:00PM;ASA 2   KNEE SURGERY     right   SVT ABLATION N/A 11/22/2019   Procedure: SVT ABLATION;  Surgeon: Hillis Range, MD;  [facility];  Service: Cardiovascular;  Laterality: N/A;    Family History  Problem Relation Age of Onset   Heart attack Other        grandfather   Colon cancer Neg Hx    Colon polyps Neg Hx    Celiac disease Neg Hx     Pancreatic cancer Neg Hx    Stomach cancer Neg Hx    Ulcerative colitis Neg Hx    Crohn's disease Neg Hx     Allergies as of 03/31/2023   (No Known Allergies)    Social History   Socioeconomic History   Marital status: Single    Spouse name: Not on file   Number of children: Not on file   Years of education: Not on file   Highest education level: Not on file  Occupational History   Occupation: [employer]  Tobacco Use   Smoking status: Every Day    Current packs/day: 0.12    Average packs/day: 0.1 packs/day for 22.7 years (2.7 ttl pk-yrs)    Types: Cigarettes    Start date: 06/30/2000   Smokeless tobacco: Never  Vaping Use  Vaping status: Never Used  Substance and Sexual Activity   Alcohol use: Yes    Alcohol/week: 0.0 standard drinks of alcohol    Comment: Drinks at least 3-4 times a week, vodka and cranberry, normally after work    Drug use: No   Sexual activity: Not on file  Other Topics Concern   Not on file  Social History Narrative   Lives in [place] with fiance and son   Works in Presenter, broadcasting at [employer].   Social Determinants of Corporate investment banker Strain: Not on [employer] Insecurity: Not on file  Transportation Needs: Not on file  Physical Activity: Not on file  Stress: Not on file  Social Connections: Unknown (11/11/2021)   Received [facility]   Social Network    Social Network: Not on file   Review of Systems   Gen: Denies fever, chills, anorexia. Denies fatigue, weakness, weight loss.  CV: Denies chest pain, palpitations, syncope, peripheral edema, and claudication. Resp: Denies dyspnea at rest, cough, wheezing, coughing up blood, and pleurisy. GI: See HPI Derm: Denies rash, itching, dry skin Psych: Denies depression, anxiety, memory loss, confusion. No homicidal or suicidal ideation.  Heme: Denies bruising, bleeding, and enlarged lymph nodes.  Physical Exam   BP 125/69 (BP Location: Right Arm,  Patient Position: Sitting, Cuff Size: Normal)   Pulse 96   Temp 97.7 F (36.5 C) (Temporal)   Ht 5\' 4"  (1.626 m)   Wt 196 lb 12.8 oz (89.3 kg)   LMP 11/08/2019 (Approximate)   SpO2 96%   BMI 33.78 kg/m   General:   Alert and oriented. No distress noted. Pleasant and cooperative.  Head:  Normocephalic and atraumatic. Eyes:  Conjuctiva clear without scleral icterus. Mouth:  Oral mucosa pink and moist. Good dentition. No lesions. Lungs:  Clear to auscultation bilaterally. No wheezes, rales, or rhonchi. No distress.  Heart:  S1, S2 present without murmurs appreciated.  Abdomen:  +BS, soft, non-distended.  Mild TTP throughout  > in the epigastrium and LUQ. No rebound or guarding. No HSM or masses noted. Rectal: deferred Msk:  Symmetrical without gross deformities. Normal posture. Extremities:  Without edema. Neurologic:  Alert and  oriented x4 Psych:  Alert and cooperative. Normal mood and affect.  Assessment  JEFFIE WIDDOWSON is a 57 y.o. female with a history of HTN, SVT, asthma, mixed IBS, and GERD presenting today for for follow up on reflux.    GERD, Epigastric pain, bloating: Typical acid reflux/heartburn symptoms controlled with twice daily PPI however she continues to have epigastric discomfort and fullness/bloating feeling.  Has been changing of her diet and decreased her alcohol intake significantly since her last visit but this does not seem to be helping much with the bloating and epigastric fullness.  We discussed that on her recent EGD she had a 3 cm hiatal hernia which could be contributing to some of her discomfort given her symptoms seem to be worse with bending over and lifting however unable to rule out abdominal hernia.  No obvious hernia palpated today on exam however she has not had any abdominal imaging other than x-ray within the last several years therefore we will repeat a CT scan of the abdomen pelvis to assess for hernia or any other abnormality to explain etiology of  her symptoms.  Also advised that we should complete celiac panel for completeness to ensure she does not have a gluten sensitivity.  Encouraged her to continue  pantoprazole twice daily given this is controlling her symptoms.  Also to evaluate her bloating we will test for sucrase deficiency with breath testing, kit provided to her today.  Advised for her to continue Gas-X or Phazyme as needed and avoid, gas producing foods especially large amounts.  Several written education provided to her today.   Mixed IBS: History of mixed IBS, recently without any significant abdominal pain and reports her bowel regularity has been much improved since changing her diet.  PLAN   CT A/P  Continue pantoprazole 40 mg BID. Prescription updated.  Celiac panel for completeness.  Sucrase deficiency testing Continue Gas-X or Phazyme as needed. Avoid common gas producing foods.  Bloating handout provided. Follow up in 2 months.    Brooke Bonito, MSN, FNP-BC, AGACNP-BC [facility]

## 2023-03-31 NOTE — Patient Instructions (Addendum)
We will arrange a CT of your abdomen and pelvis for you.  If you are willing I would like for you to complete some additional blood work to assess for celiac.  I am providing with a kit today to test for sucrase deficiency.   Continue pantoprazole 40 mg twice daily.  If you are able to afford it you could try an increased dose of simethicone (which is the main ingredient and Gas-X).  I personally take Phazyme as needed for bloating and found that this has been very helpful and could potentially be helpful for you as well.  We will plan to follow-up in 2 months, sooner if needed.  It was a pleasure to see you today. I want to create trusting relationships with patients. If you receive a survey regarding your visit,  I greatly appreciate you taking time to fill this out on paper or through your MyChart. I value your feedback.  Brooke Bonito, MSN, FNP-BC, AGACNP-BC [facility]

## 2023-04-01 LAB — GLIADIN ANTIBODIES, SERUM
Antigliadin Abs, IgA: 3 U (ref 0–19)
Gliadin IgG: 3 U (ref 0–19)

## 2023-04-02 LAB — RETICULIN ANTIBODIES, IGA W TITER: Reticulin Ab, IgA: NEGATIVE {titer} (ref <2.5)

## 2023-04-02 LAB — TISSUE TRANSGLUTAMINASE, IGA: Tissue Transglutaminase Ab, IgA: <2 U/mL (ref 0–3)

## 2023-04-03 ENCOUNTER — Ambulatory Visit (HOSPITAL_BASED_OUTPATIENT_CLINIC_OR_DEPARTMENT_OTHER): Admission: RE | Admit: 2023-04-03 | Payer: Medicaid Other | Source: Ambulatory Visit

## 2023-04-04 ENCOUNTER — Ambulatory Visit (HOSPITAL_BASED_OUTPATIENT_CLINIC_OR_DEPARTMENT_OTHER): Payer: Medicaid Other

## 2023-04-10 ENCOUNTER — Ambulatory Visit (HOSPITAL_BASED_OUTPATIENT_CLINIC_OR_DEPARTMENT_OTHER): Admission: RE | Admit: 2023-04-10 | Payer: Medicaid Other | Source: Ambulatory Visit

## 2023-04-19 ENCOUNTER — Ambulatory Visit (HOSPITAL_BASED_OUTPATIENT_CLINIC_OR_DEPARTMENT_OTHER)
Admission: RE | Admit: 2023-04-19 | Discharge: 2023-04-19 | Disposition: A | Discharge status: Home / Self Care | Payer: Medicaid Other | Source: Ambulatory Visit | Attending: Gastroenterology | Admitting: Gastroenterology

## 2023-04-19 DIAGNOSIS — R140 Abdominal distension (gaseous): Secondary | ICD-10-CM | POA: Insufficient documentation

## 2023-04-19 DIAGNOSIS — R1013 Epigastric pain: Secondary | ICD-10-CM | POA: Diagnosis present

## 2023-04-19 MED ORDER — IOHEXOL 300 MG/ML  SOLN
100.0000 mL | Freq: Once | INTRAMUSCULAR | Status: AC | PRN
  Administered 2023-04-19: 100 mL via INTRAVENOUS

## 2023-04-23 LAB — POCT I-STAT CREATININE: Creatinine, Ser: 1 mg/dL (ref 0.44–1.00)

## 2023-06-02 ENCOUNTER — Ambulatory Visit: Payer: Medicaid Other | Admitting: Gastroenterology

## 2023-06-02 ENCOUNTER — Encounter: Payer: Self-pay | Admitting: Gastroenterology
